# Patient Record
Sex: Female | Born: 2000 | Race: White | Hispanic: No | Marital: Single | Smoking: Never smoker
Health system: Southern US, Community
[De-identification: ages and names within clinical notes are randomized; demographics above are authoritative.]

## PROBLEM LIST (undated history)

## (undated) DIAGNOSIS — Z973 Presence of spectacles and contact lenses: Secondary | ICD-10-CM

## (undated) DIAGNOSIS — Q8740 Marfan's syndrome, unspecified: Secondary | ICD-10-CM

## (undated) DIAGNOSIS — T7840XA Allergy, unspecified, initial encounter: Secondary | ICD-10-CM

## (undated) DIAGNOSIS — M958 Other specified acquired deformities of musculoskeletal system: Secondary | ICD-10-CM

## (undated) DIAGNOSIS — S83241A Other tear of medial meniscus, current injury, right knee, initial encounter: Secondary | ICD-10-CM

## (undated) HISTORY — PX: TYMPANOSTOMY TUBE PLACEMENT: SHX32

## (undated) HISTORY — PX: OTHER SURGICAL HISTORY: SHX169

---
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
## 2019-04-11 ENCOUNTER — Other Ambulatory Visit: Payer: Self-pay | Admitting: Obstetrics & Gynecology

## 2019-04-11 ENCOUNTER — Ambulatory Visit
Admission: RE | Admit: 2019-04-11 | Discharge: 2019-04-11 | Disposition: A | Discharge status: Home / Self Care | Payer: Medicaid Other | Source: Ambulatory Visit | Attending: Obstetrics & Gynecology | Admitting: Obstetrics & Gynecology

## 2019-04-11 ENCOUNTER — Other Ambulatory Visit: Payer: Self-pay

## 2019-04-11 DIAGNOSIS — N6310 Unspecified lump in the right breast, unspecified quadrant: Secondary | ICD-10-CM

## 2019-06-20 ENCOUNTER — Other Ambulatory Visit: Payer: Self-pay | Admitting: Orthopedic Surgery

## 2019-07-16 ENCOUNTER — Other Ambulatory Visit (HOSPITAL_COMMUNITY): Payer: Medicaid Other

## 2019-08-30 ENCOUNTER — Ambulatory Visit (HOSPITAL_BASED_OUTPATIENT_CLINIC_OR_DEPARTMENT_OTHER): Admit: 2019-08-30 | Payer: Medicaid Other | Admitting: Orthopedic Surgery

## 2019-08-30 ENCOUNTER — Encounter (HOSPITAL_BASED_OUTPATIENT_CLINIC_OR_DEPARTMENT_OTHER): Payer: Self-pay

## 2019-08-30 SURGERY — EXCISION, GANGLION CYST, WRIST
Anesthesia: Choice | Site: Wrist | Laterality: Left

## 2019-10-15 ENCOUNTER — Other Ambulatory Visit: Payer: Self-pay

## 2019-10-15 ENCOUNTER — Other Ambulatory Visit: Payer: Self-pay | Admitting: Obstetrics & Gynecology

## 2019-10-15 ENCOUNTER — Ambulatory Visit
Admission: RE | Admit: 2019-10-15 | Discharge: 2019-10-15 | Disposition: A | Discharge status: Home / Self Care | Payer: Medicaid Other | Source: Ambulatory Visit | Attending: Obstetrics & Gynecology | Admitting: Obstetrics & Gynecology

## 2019-10-15 DIAGNOSIS — N6310 Unspecified lump in the right breast, unspecified quadrant: Secondary | ICD-10-CM

## 2019-10-29 ENCOUNTER — Ambulatory Visit (HOSPITAL_COMMUNITY)
Admission: EM | Admit: 2019-10-29 | Discharge: 2019-10-29 | Disposition: A | Discharge status: Home / Self Care | Payer: Medicaid Other | Attending: Emergency Medicine | Admitting: Emergency Medicine

## 2019-10-29 ENCOUNTER — Other Ambulatory Visit: Payer: Self-pay

## 2019-10-29 ENCOUNTER — Encounter (HOSPITAL_COMMUNITY): Payer: Self-pay

## 2019-10-29 DIAGNOSIS — N390 Urinary tract infection, site not specified: Secondary | ICD-10-CM | POA: Insufficient documentation

## 2019-10-29 DIAGNOSIS — Z3202 Encounter for pregnancy test, result negative: Secondary | ICD-10-CM

## 2019-10-29 LAB — POCT URINALYSIS DIPSTICK, ED / UC
Bilirubin Urine: NEGATIVE
Glucose, UA: NEGATIVE mg/dL
Ketones, ur: NEGATIVE mg/dL
Nitrite: NEGATIVE
Protein, ur: 30 mg/dL — AB
Specific Gravity, Urine: 1.015 (ref 1.005–1.030)
Urobilinogen, UA: 0.2 mg/dL (ref 0.0–1.0)
pH: 7 (ref 5.0–8.0)

## 2019-10-29 LAB — POC URINE PREG, ED: Preg Test, Ur: NEGATIVE

## 2019-10-29 MED ORDER — NITROFURANTOIN MONOHYD MACRO 100 MG PO CAPS: 100.0000 mg | ORAL_CAPSULE | Freq: Two times a day (BID) | ORAL | 0 refills | Status: AC

## 2019-10-29 NOTE — ED Triage Notes (Signed)
Pt presents with pressure in urinary tract and abdominal pain x 2 days.

## 2019-10-29 NOTE — Discharge Instructions (Signed)
Urine showed evidence of infection. We are treating you with macrobid- twice daily for 5 days. Be sure to take full course. Stay hydrated- urine should be pale yellow to clear.  °Please return or follow up with your primary provider if symptoms not improving with treatment. Please return sooner if you have worsening of symptoms or develop fever, nausea, vomiting, abdominal pain, back pain, lightheadedness, dizziness. °

## 2019-10-30 NOTE — ED Provider Notes (Signed)
[facility]    CSN: [id number] Arrival date & time: 10/29/19  1903      History   Chief Complaint Chief Complaint  Patient presents with   Urinary Tract Infection    HPI Elizabeth Lucero is a 19 y.o. female presenting today for evaluation of possible UTI.  Patient reports that over the past 2 days here she has had urinary frequency, urgency, incomplete voiding as well as continued pressure in her lower abdomen.  Denies dysuria.  Denies hematuria.  Denies vaginal symptoms.  Is on oral contraceptives.  Denies significant history of UTIs.  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 09/24/2015   Marfan syndrome 06/23/2011    Past Surgical History:  Procedure Laterality Date   ADENOIDECTOMY  08/01/2003   CLOSED REDUCTION NASAL FRACTURE  02/08/2010   FOOT FOREIGN BODY REMOVAL Left 10/27/2005   KNEE ARTHROSCOPY WITH DRILLING/MICROFRACTURE Right 09/29/2015   Procedure: RIGHT KNEE ARTHROSCOPY WITH MEDIAL MENISCECTOMY , MICROFRACTURE DRILLING;  Surgeon: Salvatore Marvel, MD;  Location: [facility];  Service: Orthopedics;  Laterality: Right;   PERCUTANEOUS PINNING ELBOW FRACTURE Left 12/09/2008   medial epicondyle   Surgery on right eye orbital floor     Cheerleading accident   TYMPANOSTOMY TUBE PLACEMENT Bilateral 12/07/2001; 08/01/2003    OB History   No obstetric history on file.      Home Medications    Prior to Admission medications   Medication Sig Start Date End Date Taking? Authorizing Provider  acetaminophen (TYLENOL) 500 MG tablet Take 1,000 mg  by mouth every 6 (six) hours as needed.    [provider]  atenolol (TENORMIN) 25 MG tablet Take 25 mg by mouth. 01/21/15   [provider]  butalbital-acetaminophen-caffeine (FIORICET, ESGIC) 50-325-40 MG tablet Take 1 tablet by mouth every 6 (six) hours as needed for headache or migraine. 04/30/16   Trixie Dredge, PA-C  cetirizine (ZYRTEC) 10 MG tablet Take 10 mg by mouth. 06/23/11   [provider]  ibuprofen (ADVIL,MOTRIN) 200 MG tablet Take 400-800 mg by mouth every 6 (six) hours as needed (migraine).    [provider]  nitrofurantoin, macrocrystal-monohydrate, (MACROBID) 100 MG capsule Take 1 capsule (100 mg total) by mouth 2 (two) times daily for 5 days. 10/29/19 11/03/19  Juaquina Machnik C, PA-C  ondansetron (ZOFRAN) 4 MG tablet Take 1 tablet (4 mg total) by mouth every 8 (eight) hours as needed for nausea or vomiting. 04/30/16   Trixie Dredge, PA-C  SPRINTEC 28 0.25-35 MG-MCG tablet TAKE 1 TABLET BY MOUTH EVERY DAY (START ON THE SUNDAY AFTER NEXT CYCLE) 07/09/15   [provider]    Family History Family History  Problem Relation Age of Onset   Marfan syndrome Mother    Diabetes Other    Cancer - Colon Other     Social History Social History   Tobacco Use   Smoking status: Never Smoker   Smokeless tobacco: Never Used  Substance Use Topics   Alcohol use: No    Alcohol/week:  0.0 standard drinks   Drug use: No     Allergies   Codeine and Milk-related compounds   Review of Systems Review of Systems  Constitutional: Negative for fever.  Respiratory: Negative for shortness of breath.   Cardiovascular: Negative for chest pain.  Gastrointestinal: Positive for abdominal pain. Negative for diarrhea, nausea and vomiting.  Genitourinary: Positive for frequency. Negative for dysuria, flank pain, genital sores, hematuria, menstrual problem, vaginal bleeding, vaginal discharge and vaginal pain.  Musculoskeletal: Negative for back pain.    Skin: Negative for rash.  Neurological: Negative for dizziness, light-headedness and headaches.     Physical Exam Triage Vital Signs ED Triage Vitals  Enc Vitals Group     BP 10/29/19 2025 (!) 109/97     Pulse Rate 10/29/19 2025 100     Resp 10/29/19 2025 18     Temp --      Temp Source 10/29/19 2025 Oral     SpO2 10/29/19 2025 100 %     Weight --      Height --      Head Circumference --      Peak Flow --      Pain Score 10/29/19 2027 6     Pain Loc --      Pain Edu? --      Excl. in GC? --    No data found.  Updated Vital Signs BP (!) 109/97 (BP Location: Right Arm)    Pulse 100    Resp 18    SpO2 100%   Visual Acuity Right Eye Distance:   Left Eye Distance:   Bilateral Distance:    Right Eye Near:   Left Eye Near:    Bilateral Near:     Physical Exam Vitals and nursing note reviewed.  Constitutional:      Appearance: She is well-developed.     Comments: No acute distress  HENT:     Head: Normocephalic and atraumatic.     Nose: Nose normal.  Eyes:     Conjunctiva/sclera: Conjunctivae normal.  Cardiovascular:     Rate and Rhythm: Normal rate.  Pulmonary:     Effort: Pulmonary effort is normal. No respiratory distress.  Abdominal:     General: There is no distension.  Musculoskeletal:        General: Normal range of motion.     Cervical back: Neck supple.  Skin:    General: Skin is warm and dry.  Neurological:     Mental Status: She is alert and oriented to person, place, and time.      UC Treatments / Results  Labs (all labs ordered are listed, but only abnormal results are displayed) Labs Reviewed  POCT URINALYSIS DIPSTICK, ED / UC - Abnormal; Notable for the following components:      Result Value   Hgb urine dipstick MODERATE (*)    Protein, ur 30 (*)    Leukocytes,Ua LARGE (*)    All other components within normal limits  URINE CULTURE  POC URINE PREG, ED    EKG   Radiology No results found.  Procedures Procedures (including  critical care time)  Medications Ordered in UC Medications - No data to display  Initial Impression / Assessment and Plan / UC Course  I have reviewed the triage vital signs and the nursing notes.  Pertinent labs & imaging results that were available during my care of the patient were reviewed by me and considered in my medical decision making (see chart for  details).     Large leuks on UA, will send for culture urine culture.  Treating for UTI empirically with Macrobid twice daily x5 days.  Will alter results based off culture if needed.  Push fluids.  Vital signs stable.  Discussed strict return precautions. Patient verbalized understanding and is agreeable with plan.  Final Clinical Impressions(s) / UC Diagnoses   Final diagnoses:  Lower urinary tract infectious disease     Discharge Instructions     Urine showed evidence of infection. We are treating you with macrobid- twice daily for 5 days. Be sure to take full course. Stay hydrated- urine should be pale yellow to clear.   Please return or follow up with your primary provider if symptoms not improving with treatment. Please return sooner if you have worsening of symptoms or develop fever, nausea, vomiting, abdominal pain, back pain, lightheadedness, dizziness.   ED Prescriptions    Medication Sig Dispense Auth. Provider   nitrofurantoin, macrocrystal-monohydrate, (MACROBID) 100 MG capsule Take 1 capsule (100 mg total) by mouth 2 (two) times daily for 5 days. 10 capsule Jalon Blackwelder, [place] C, PA-C     PDMP not reviewed this encounter.   Lew Dawes, PA-C 10/30/19 1149

## 2019-11-01 LAB — URINE CULTURE: Culture: >=100000 — AB

## 2019-12-10 ENCOUNTER — Encounter: Payer: Self-pay | Admitting: Neurology

## 2020-02-24 NOTE — Progress Notes (Deleted)
NEUROLOGY CONSULTATION NOTE  [name] MRN: [id number] DOB: Aug 03, 2000  Referring provider: Lynelle Smoke, MD Primary care provider: Velvet Bathe, MD  Reason for consult:  migraine   Subjective:  Elizabeth Lucero is a 19 year old ***-handed female with Marfan syndrome, asthma and anxiety who presents for migraines.  History supplemented by referring provider's note.  Onset:  *** Location:  *** Quality:  *** Intensity:  ***.  *** denies new headache, thunderclap headache or severe headache that wakes her from sleep. Aura:  *** Prodrome:  *** Postdrome:  *** Associated symptoms:  ***.  *** denies associated unilateral numbness or weakness. Duration:  *** Frequency:  *** Frequency of abortive medication: *** Triggers:  *** Relieving factors:  *** Activity:  ***  Prior brain imaging: CT head without contrast on 04/10/2016 personally reviewed and was unremarkable.  MRI of brain with and without contrast from 04/27/2005 personally reviewed showed nonspecific patchy bilateral frontal hyperintense T2/FLAIR foci without enhancement.  Repeat MRI of brain without contrast on 05/06/2010 personally reviewed and findings appeared stable.    Current NSAIDS/analgesics:  *** Current triptans:  *** Current ergotamine:  *** Current anti-emetic:  *** Current muscle relaxants:  *** Current Antihypertensive medications:  Atenolol 25mg  BID Current Antidepressant medications:  Sertraline 50mg  Current Anticonvulsant medications:  *** Current anti-CGRP:  *** Current Vitamins/Herbal/Supplements:  Magnesium 500mg , D  Current Antihistamines/Decongestants:  Zyrtec Other therapy:  *** Hormone/birth control:  *** Other medications:  ***  Past NSAIDS/analgesics:  *** Past abortive triptans:  *** Past abortive ergotamine:  *** Past muscle relaxants:  *** Past anti-emetic:  *** Past antihypertensive medications:  *** Past antidepressant medications:  *** Past anticonvulsant medications:   *** Past anti-CGRP:  *** Past vitamins/Herbal/Supplements:  *** Past antihistamines/decongestants:  *** Other past therapies:  ***  Caffeine:  *** Alcohol:  *** Smoker:  *** Diet:  *** Exercise:  *** Depression:  ***; Anxiety:  *** Other pain:  *** Sleep hygiene:  *** Family history of headache:  ***      PAST MEDICAL HISTORY: Past Medical History:  Diagnosis Date  . Acute medial meniscus tear of right knee 09/24/2015  . Allergy    Seasonal   . Marfan syndrome    treated by [facility] ped and [facility] card Dr 09/26/2015  . 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/24/2015  . Wears glasses     PAST SURGICAL HISTORY: Past Surgical History:  Procedure Laterality Date  . ADENOIDECTOMY  08/01/2003  . CLOSED REDUCTION NASAL FRACTURE  02/08/2010  . FOOT FOREIGN BODY REMOVAL Left 10/27/2005  . KNEE ARTHROSCOPY WITH DRILLING/MICROFRACTURE Right 09/29/2015   Procedure: RIGHT KNEE ARTHROSCOPY WITH MEDIAL MENISCECTOMY , MICROFRACTURE DRILLING;  Surgeon: 14/07/2009, MD;  Location: [facility];  Service: Orthopedics;  Laterality: Right;  . PERCUTANEOUS PINNING ELBOW FRACTURE Left 12/09/2008   medial epicondyle  . Surgery on right eye orbital floor     Cheerleading accident  . TYMPANOSTOMY TUBE PLACEMENT Bilateral 12/07/2001; 08/01/2003    MEDICATIONS: Current Outpatient Medications on File Prior to Visit  Medication Sig Dispense Refill  . acetaminophen (TYLENOL) 500 MG tablet Take 1,000 mg by mouth every 6 (six) hours as needed.    02/08/2009 atenolol (TENORMIN) 25 MG tablet Take 25 mg by mouth.    . butalbital-acetaminophen-caffeine (FIORICET, ESGIC) 50-325-40 MG tablet Take 1 tablet by mouth every 6 (six) hours as needed for headache or migraine. 10 tablet 0  . cetirizine (ZYRTEC) 10 MG tablet Take 10  mg by mouth.    Marland Kitchen ibuprofen (ADVIL,MOTRIN) 200 MG tablet Take 400-800 mg by mouth every 6 (six) hours as needed (migraine).    . ondansetron (ZOFRAN) 4 MG tablet Take 1 tablet (4 mg  total) by mouth every 8 (eight) hours as needed for nausea or vomiting. 15 tablet 0  . SPRINTEC 28 0.25-35 MG-MCG tablet TAKE 1 TABLET BY MOUTH EVERY DAY (START ON THE SUNDAY AFTER NEXT CYCLE)  5   Current Facility-Administered Medications on File Prior to Visit  Medication Dose Route Frequency Provider Last Rate Last Admin  . traMADol (ULTRAM) tablet 50-100 mg  50-100 mg Oral Q6H PRN Shepperson, Kirstin, PA-C        ALLERGIES: Allergies  Allergen Reactions  . Codeine Itching  . Milk-Related Compounds Nausea And Vomiting    Stomach upset with milk    FAMILY HISTORY: Family History  Problem Relation Age of Onset  . Marfan syndrome Mother   . Diabetes Other   . Cancer - Colon Other    ***.  SOCIAL HISTORY: Social History   Socioeconomic History  . Marital status: Single    Spouse name: Not on file  . Number of children: Not on file  . Years of education: Not on file  . Highest education level: Not on file  Occupational History  . Not on file  Tobacco Use  . Smoking status: Never Smoker  . Smokeless tobacco: Never Used  Substance and Sexual Activity  . Alcohol use: No    Alcohol/week: 0.0 standard drinks  . Drug use: No  . Sexual activity: Yes    Birth control/protection: Condom, Pill  Other Topics Concern  . Not on file  Social History Narrative  . Not on file   Social Determinants of Health   Financial Resource Strain: Not on file  Food Insecurity: Not on file  Transportation Needs: Not on file  Physical Activity: Not on file  Stress: Not on file  Social Connections: Not on file  Intimate Partner Violence: Not on file    Objective:  *** General: No acute distress.  Patient appears well-groomed.   Head:  Normocephalic/atraumatic Eyes:  fundi examined but not visualized Neck: supple, no paraspinal tenderness, full range of motion Back: No paraspinal tenderness Heart: regular rate and rhythm Lungs: Clear to auscultation bilaterally. Vascular: No  carotid bruits. Neurological Exam: Mental status: alert and oriented to person, place, and time, recent and remote memory intact, fund of knowledge intact, attention and concentration intact, speech fluent and not dysarthric, language intact. Cranial nerves: CN I: not tested CN II: pupils equal, round and reactive to light, visual fields intact CN III, IV, VI:  full range of motion, no nystagmus, no ptosis CN V: facial sensation intact. CN VII: upper and lower face symmetric CN VIII: hearing intact CN IX, X: gag intact, uvula midline CN XI: sternocleidomastoid and trapezius muscles intact CN XII: tongue midline Bulk & Tone: normal, no fasciculations. Motor:  muscle strength 5/5 throughout Sensation:  Pinprick, temperature and vibratory sensation intact. Deep Tendon Reflexes:  2+ throughout,  toes downgoing.   Finger to nose testing:  Without dysmetria.   Heel to shin:  Without dysmetria.   Gait:  Normal station and stride.  Romberg negative.  Assessment/Plan:   ***    Thank you for allowing me to take part in the care of this patient.  Shon Millet, DO  CC:  Elizabeth Bathe, MD  Elizabeth Smoke, MD

## 2020-02-25 ENCOUNTER — Ambulatory Visit: Payer: Medicaid Other | Admitting: Neurology

## 2020-03-10 IMAGING — US ULTRASOUND RIGHT BREAST LIMITED
1 series · 6 of 6 positions shown · non-contrast
Comparison: Previous exam(s).

CLINICAL DATA: Palpable abnormality in the RIGHT breast.

EXAM:
ULTRASOUND OF THE RIGHT BREAST

[Series 1: ultrasound right breast limited · 0.07mm/px · 6 of 6 slices shown]
[im 1/6]
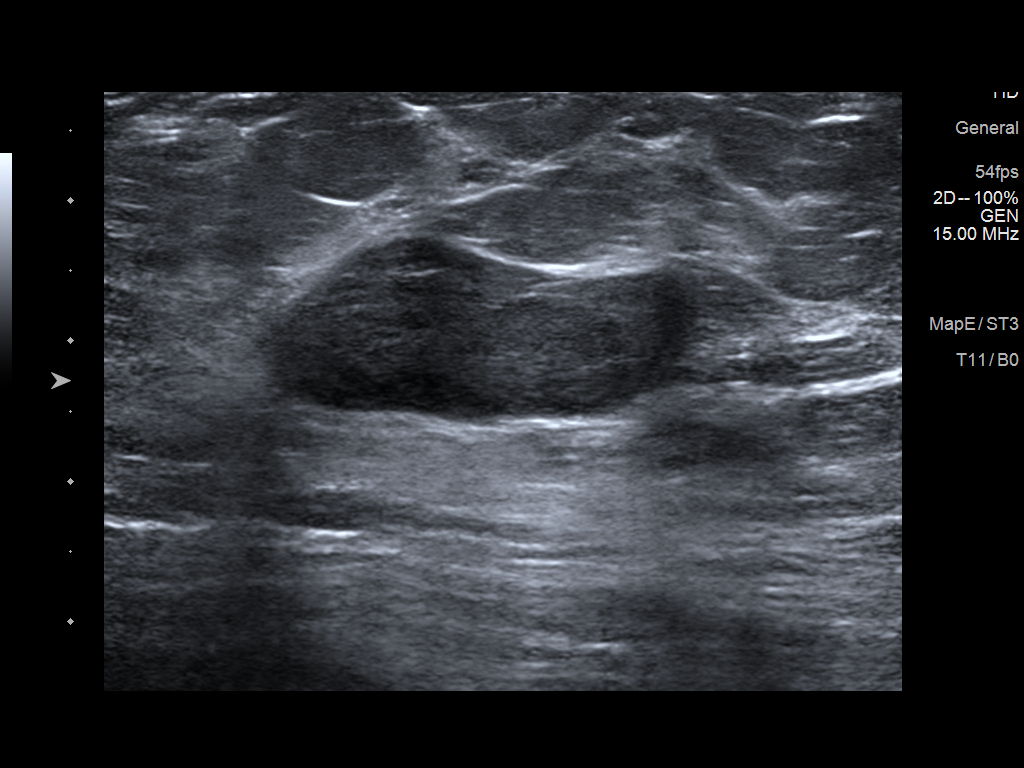
[im 2/6]
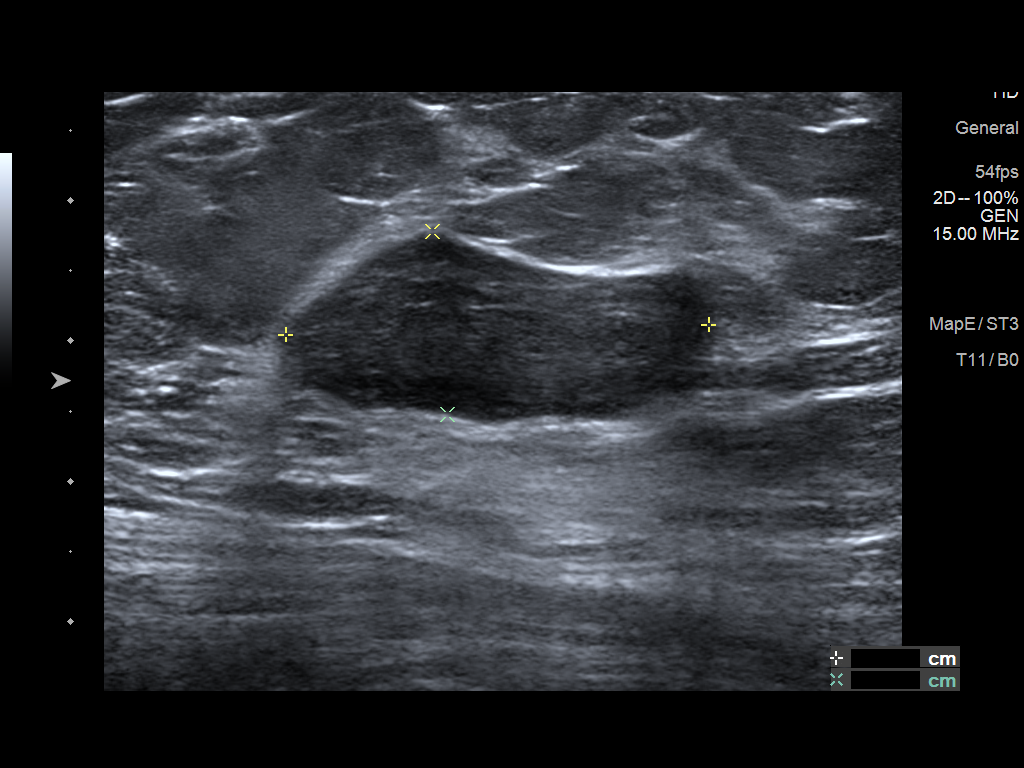
[im 3/6]
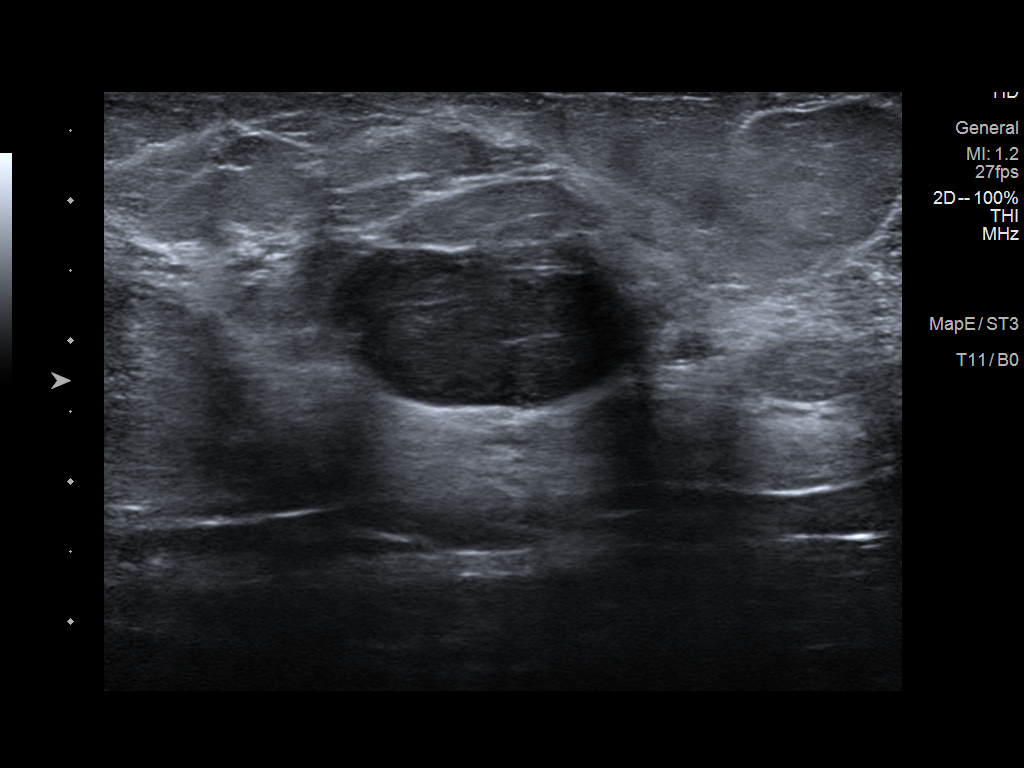
[im 4/6]
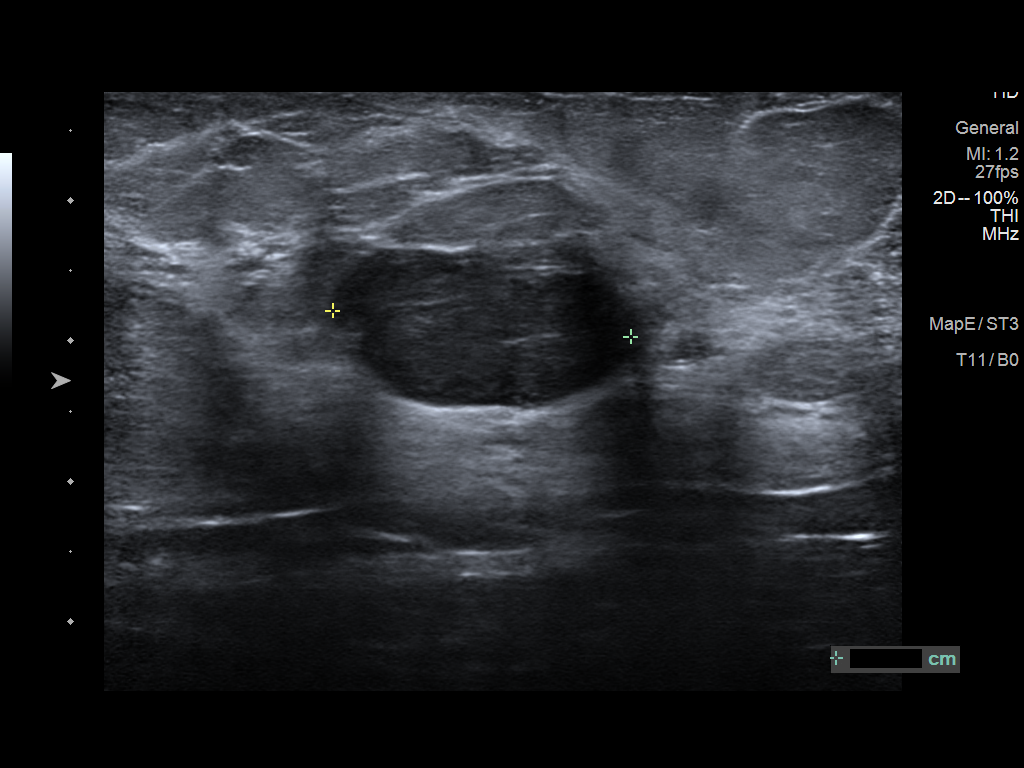
[im 5/6]
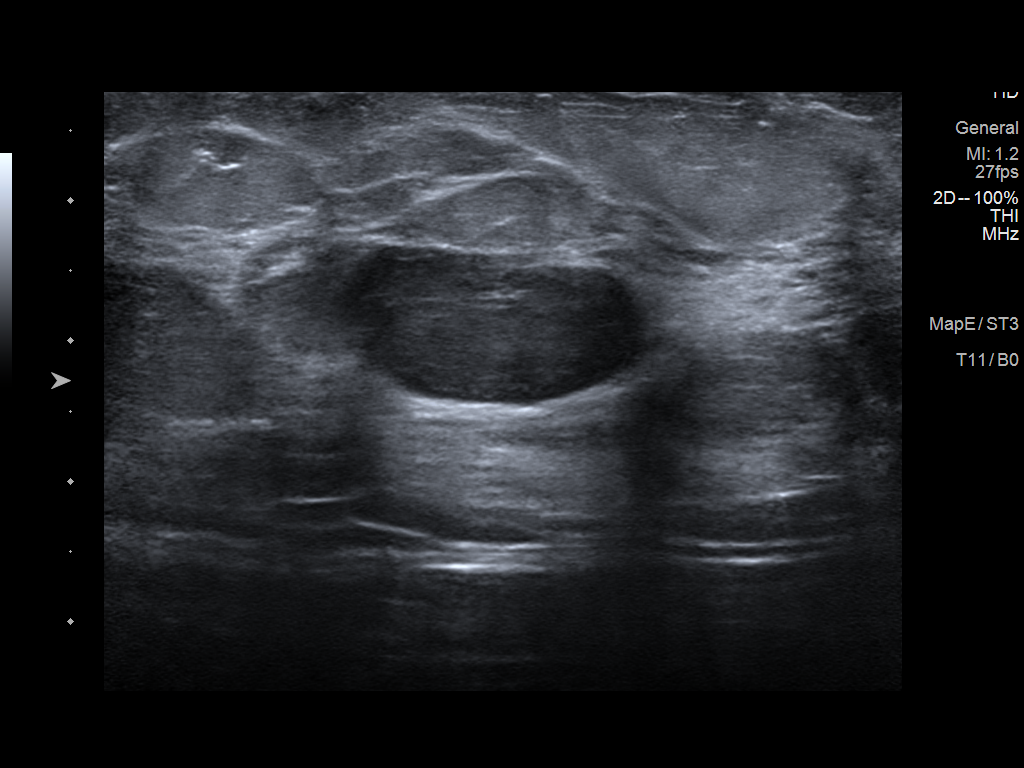
[im 6/6]
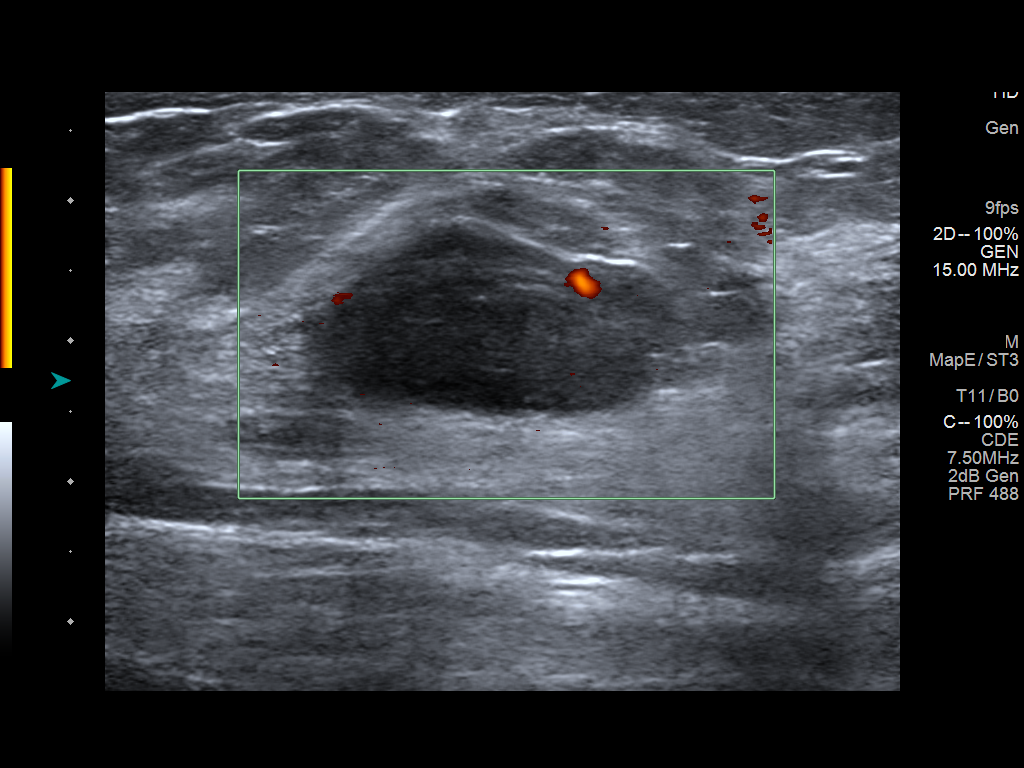

[6 of 6 positions shown; findings below may reference images not displayed]

FINDINGS: On physical exam, there is a rounded mobile mass in the 10 o'clock
location of the RIGHT breast.

Targeted ultrasound is performed, showing an oval hypoechoic
parallel mass with posterior acoustic enhancement in the 10 o'clock
location of the RIGHT breast 4 centimeters from the nipple which
measures 3.0 x 1.3 x 2.1 centimeters. There is minimal associated
internal vascularity.
IMPRESSION: Probable fibroadenoma in the 10 o'clock location of the RIGHT
breast. We discussed management options including excision, biopsy,
and close follow-up. Imaging followup is recommended at 6, 12, and
24 months to assess stability. The patient concurs with this plan.

RECOMMENDATION:
RIGHT breast ultrasound recommended in 6 months.

I have discussed the findings and recommendations with the patient.
Results were also provided in writing at the conclusion of the
visit. If applicable, a reminder letter will be sent to the patient
regarding the next appointment.

BI-RADS CATEGORY  3: Probably benign.

## 2020-04-20 ENCOUNTER — Other Ambulatory Visit: Payer: Self-pay

## 2020-04-20 ENCOUNTER — Other Ambulatory Visit: Payer: Self-pay | Admitting: Obstetrics & Gynecology

## 2020-04-20 ENCOUNTER — Ambulatory Visit
Admission: RE | Admit: 2020-04-20 | Discharge: 2020-04-20 | Disposition: A | Discharge status: Home / Self Care | Payer: Medicaid Other | Source: Ambulatory Visit | Attending: Obstetrics & Gynecology | Admitting: Obstetrics & Gynecology

## 2020-04-20 DIAGNOSIS — N6310 Unspecified lump in the right breast, unspecified quadrant: Secondary | ICD-10-CM

## 2020-04-22 ENCOUNTER — Other Ambulatory Visit: Payer: Self-pay

## 2020-04-22 ENCOUNTER — Other Ambulatory Visit: Payer: Self-pay | Admitting: Obstetrics & Gynecology

## 2020-04-22 ENCOUNTER — Other Ambulatory Visit: Payer: Self-pay | Admitting: General Practice

## 2020-04-22 ENCOUNTER — Ambulatory Visit
Admission: RE | Admit: 2020-04-22 | Discharge: 2020-04-22 | Disposition: A | Discharge status: Home / Self Care | Payer: Medicaid Other | Source: Ambulatory Visit | Attending: Obstetrics & Gynecology | Admitting: Obstetrics & Gynecology

## 2020-04-22 DIAGNOSIS — N6310 Unspecified lump in the right breast, unspecified quadrant: Secondary | ICD-10-CM

## 2020-04-24 ENCOUNTER — Other Ambulatory Visit: Payer: Medicaid Other

## 2020-10-27 ENCOUNTER — Ambulatory Visit: Payer: Medicaid Other | Admitting: Neurology

## 2021-01-04 ENCOUNTER — Encounter: Payer: Self-pay | Admitting: Neurology

## 2021-01-04 ENCOUNTER — Ambulatory Visit (INDEPENDENT_AMBULATORY_CARE_PROVIDER_SITE_OTHER): Payer: Medicaid Other | Admitting: Neurology

## 2021-01-04 ENCOUNTER — Other Ambulatory Visit: Payer: Self-pay

## 2021-01-04 VITALS — BP 115/77 | HR 51 | Ht 67.0 in | Wt 215.8 lb

## 2021-01-04 DIAGNOSIS — G43109 Migraine with aura, not intractable, without status migrainosus: Secondary | ICD-10-CM

## 2021-01-04 MED ORDER — ONDANSETRON 4 MG PO TBDP: 4.0000 mg | ORAL_TABLET | Freq: Three times a day (TID) | ORAL | 3 refills | Status: AC | PRN

## 2021-01-04 MED ORDER — RIZATRIPTAN BENZOATE 10 MG PO TBDP: 10.0000 mg | ORAL_TABLET | ORAL | 11 refills | Status: AC | PRN

## 2021-01-04 NOTE — Progress Notes (Signed)
[facility]    Provider:  Dr Lucia Gaskins Requesting Provider: Velvet Bathe, MD Primary Care Provider:  Velvet Bathe, MD  CC:  Migraine with aura  HPI:  Elizabeth Lucero is a 20 y.o. female here as requested by Velvet Bathe, MD for frequent headaches. PMHx Marfan Syndrome. Surgery of right orbital floor,nasal fracture. It is in both eyes, different flashes of color, same thing over and over, followed by head pain which is holocephalic, eyes hurt, pounding/pulsating/throbbing, photo/phonophobia, nausea, vomiting, moving makes it hurt worse, a dark room helps, No known triggers, weather doesn't trigger, caffeine withdrawal may be a trigger, not hormonal around periods, occur during the day, sleeping helps, not positional or exertional, no changes in quality, she always has migraines never has regular headaches. They can last up to 24 hours at least 4 days a month and some months can have a headache for 7 days straight, no other headaches. They can be severe 10/10. No other focal neurologic deficits, associated symptoms, inciting events or modifiable factors.She states her mother had migraine with aura/ocular migraines. She has had these since 5th great. She has an alteration in vision.   Reviewed notes, labs and imaging from outside physicians, which showed:  CT Head 04/30/2016:   Ct showed No acute intracranial abnormalities including mass lesion or mass effect, hydrocephalus, extra-axial fluid collection, midline shift, hemorrhage, or acute infarction, large ischemic events (personally reviewed images)   MRI brain: 05/06/2010: FINDINGS: Brain: Ventricles, cisterns and other CSF spaces are within normal. There is no mass, mass effect, shift of midline structures or acute hemorrhage. No evidence of acute infarction.  Vascular: Within normal.  Skull: Within normal.  Sinuses/Orbits: Within normal.  Other: None.  IMPRESSION: No acute intracranial findings. (By my review  also some non-specific chronic frontal white matter changes likely microvascular ischemia or sequelae of migraines)  From a thorough review of records, medications tried that can be used in headache and migraine management include: Atenolol, Fioricet, Benadryl, ibuprofen, ketorolac injections, Decadron injections, Zofran tablet and injection, Compazine injection, scopolamine patch, tramadol, ubrelvy(helped) samples, sertraline, amitriptyline/nortrip contraindicated due to being on another SSRI sertraline. Never tried Topiramate.   Review of Systems: Patient complains of symptoms per HPI as well as the following symptoms fatigue. Pertinent negatives and positives per HPI. All others negative.   Social History   Socioeconomic History   Marital status: Single    Spouse name: Not on file   Number of children: Not on file   Years of education: Not on file   Highest education level: Not on file  Occupational History   Not on file  Tobacco Use   Smoking status: Never   Smokeless tobacco: Never  Vaping Use   Vaping Use: Former   Devices: quit 2 yrs ago  Substance and Sexual Activity   Alcohol use: No    Alcohol/week: 0.0 standard drinks   Drug use: No   Sexual activity: Yes    Birth control/protection: Condom, Pill  Other Topics Concern   Not on file  Social History Narrative   Caffeine cup daily.  Education: nursing school,   Working: Production assistant, radio.     Social Determinants of Health   Financial Resource Strain: Not on file  Food Insecurity: Not on file  Transportation Needs: Not on file  Physical Activity: Not on file  Stress: Not on file  Social Connections: Not on file  Intimate Partner Violence: Not on file    Family History  Problem Relation Age of Onset  Marfan syndrome Mother    Sjogren's syndrome Maternal Grandmother    Arthritis/Rheumatoid Maternal Grandmother    Diabetes type II Paternal Grandmother    Diabetes Other    Cancer - Colon Other     Past Medical  History:  Diagnosis Date   Acute medial meniscus tear of right knee 09/24/2015   Allergy    Seasonal    Marfan syndrome    treated by [facility] ped Leona Singleton and [facility] card Dr Elizebeth Brooking   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/24/2015   Wears glasses     Patient Active Problem List   Diagnosis Date Noted   Migraine with aura and without status migrainosus, not intractable 01/04/2021   Acute medial meniscus tear of right knee 09/24/2015   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 09/24/2015   Marfan syndrome 06/23/2011    Past Surgical History:  Procedure Laterality Date   ADENOIDECTOMY  08/01/2003   CLOSED REDUCTION NASAL FRACTURE  02/08/2010   FOOT FOREIGN BODY REMOVAL Left 10/27/2005   KNEE ARTHROSCOPY WITH DRILLING/MICROFRACTURE Right 09/29/2015   Procedure: RIGHT KNEE ARTHROSCOPY WITH MEDIAL MENISCECTOMY , MICROFRACTURE DRILLING;  Surgeon: Salvatore Marvel, MD;  Location: [facility];  Service: Orthopedics;  Laterality: Right;   PERCUTANEOUS PINNING ELBOW FRACTURE Left 12/09/2008   medial epicondyle   Surgery on right eye orbital floor     Cheerleading accident   TYMPANOSTOMY TUBE PLACEMENT Bilateral 12/07/2001; 08/01/2003    Current Outpatient Medications  Medication Sig Dispense Refill   acetaminophen (TYLENOL) 500 MG tablet Take 1,000 mg by mouth every 6 (six) hours as needed.     atenolol (TENORMIN) 25 MG tablet Take 25 mg by mouth.     cetirizine (ZYRTEC) 10 MG tablet Take 10 mg by mouth.     Cholecalciferol 125 MCG/ML LIQD daily.     ibuprofen (ADVIL,MOTRIN) 200 MG tablet Take 400-800 mg by mouth every 6 (six) hours as needed (migraine).     magnesium gluconate (MAGONATE) 500 MG tablet daily.     Multiple Vitamin (MULTI-VITAMINS) TABS Take 1 tablet by mouth daily.     norgestimate-ethinyl estradiol (ORTHO-CYCLEN) 0.25-35 MG-MCG tablet Take 1 tablet by mouth daily.     ondansetron (ZOFRAN-ODT) 4 MG disintegrating tablet Take 1-2 tablets (4-8 mg total)  by mouth every 8 (eight) hours as needed. 30 tablet 3   rizatriptan (MAXALT-MLT) 10 MG disintegrating tablet Take 1 tablet (10 mg total) by mouth as needed for migraine. May repeat in 2 hours if needed 9 tablet 11   sertraline (ZOLOFT) 50 MG tablet 50 mg nightly.     Current Facility-Administered Medications  Medication Dose Route Frequency Provider Last Rate Last Admin   traMADol (ULTRAM) tablet 50-100 mg  50-100 mg Oral Q6H PRN Shepperson, Kirstin, PA-C        Allergies as of 01/04/2021 - Review Complete 01/04/2021  Allergen Reaction Noted   Codeine Itching 09/24/2015   Milk-related compounds Nausea And Vomiting 09/24/2015    Vitals: BP 115/77   Pulse (!) 51   Ht 5\' 7"  (1.702 m)   Wt 215 lb 12.8 oz (97.9 kg)   BMI 33.80 kg/m  Last Weight:  Wt Readings from Last 1 Encounters:  01/04/21 215 lb 12.8 oz (97.9 kg)   Last Height:   Ht Readings from Last 1 Encounters:  01/04/21 5\' 7"  (1.702 m)     Physical exam: Exam: Gen: NAD, conversant, well nourised, obese, well groomed  CV: RRR, no MRG. No Carotid Bruits. No peripheral edema, warm, nontender Eyes: Conjunctivae clear without exudates or hemorrhage  Neuro: Detailed Neurologic Exam  Speech:    Speech is normal; fluent and spontaneous with normal comprehension.  Cognition:    The patient is oriented to person, place, and time;     recent and remote memory intact;     language fluent;     normal attention, concentration,     fund of knowledge Cranial Nerves:    The pupils are equal, round, and reactive to light. The fundi are flat. Visual fields are full to finger confrontation. Extraocular movements are intact. Trigeminal sensation is intact and the muscles of mastication are normal. The face is symmetric. The palate elevates in the midline. Hearing intact. Voice is normal. Shoulder shrug is normal. The tongue has normal motion without fasciculations.   Coordination:    Normal  Gait:   normal.    Motor Observation:    No asymmetry, no atrophy, and no involuntary movements noted. Tone:    Normal muscle tone.    Posture:    Posture is normal. normal erect    Strength:    Strength is V/V in the upper and lower limbs.      Sensation: intact to LT     Reflex Exam:  DTR's:    Deep tendon reflexes in the upper and lower extremities are normal bilaterally.   Toes:    The toes are downgoing bilaterally.   Clonus:    Clonus is absent.    Assessment/Plan:  Patient with episodic migraine with aura.   Acute/emergent: Rizatriptan: Please take one tablet at the onset of your headache. If it does not improve the symptoms please take one additional tablet. Do not take more then 2 tablets in 24hrs. Do not take use more then 2 to 3 times in a week. Take with Ondansetron(zofran). Ondansetron: alone for nausea/motion sickness.  Discussed increased risk of stoke and estrogen in migraine with aura Discussed: To prevent or relieve headaches, try the following: Cool Compress. Lie down and place a cool compress on your head.  Avoid headache triggers. If certain foods or odors seem to have triggered your migraines in the past, avoid them. A headache diary might help you identify triggers.  Include physical activity in your daily routine. Try a daily walk or other moderate aerobic exercise.  Manage stress. Find healthy ways to cope with the stressors, such as delegating tasks on your to-do list.  Practice relaxation techniques. Try deep breathing, yoga, massage and visualization.  Eat regularly. Eating regularly scheduled meals and maintaining a healthy diet might help prevent headaches. Also, drink plenty of fluids.  Follow a regular sleep schedule. Sleep deprivation might contribute to headaches Consider biofeedback. With this mind-body technique, you learn to control certain bodily functions -- such as muscle tension, heart rate and blood pressure -- to prevent headaches or reduce headache  pain.    Proceed to emergency room if you experience new or worsening symptoms or symptoms do not resolve, if you have new neurologic symptoms or if headache is severe, or for any concerning symptom.   Provided education and documentation from American headache Society toolbox including articles on: chronic migraine medication overuse headache, chronic migraines, prevention of migraines, behavioral and other nonpharmacologic treatments for headache.     Orders Placed This Encounter  Procedures   CBC with Differential/Platelets   Comprehensive metabolic panel   TSH    Meds ordered this encounter  Medications   rizatriptan (MAXALT-MLT) 10 MG disintegrating tablet    Sig: Take 1 tablet (10 mg total) by mouth as needed for migraine. May repeat in 2 hours if needed    Dispense:  9 tablet    Refill:  11   ondansetron (ZOFRAN-ODT) 4 MG disintegrating tablet    Sig: Take 1-2 tablets (4-8 mg total) by mouth every 8 (eight) hours as needed.    Dispense:  30 tablet    Refill:  3     Cc: Velvet Bathe, MD,  Velvet Bathe, MD  Naomie Dean, MD  [facility] [address] [place], [place] [address]  Phone [phone] Fax [phone]

## 2021-01-04 NOTE — Patient Instructions (Addendum)
Acute/emergent: Rizatriptan: Please take one tablet at the onset of your headache. If it does not improve the symptoms please take one additional tablet. Do not take more then 2 tablets in 24hrs. Do not take use more then 2 to 3 times in a week. Take with Ondansetron(zofran). Ondansetron: alone for nausea/motion sickness.   There is increased risk for stroke in women with migraine with aura and a contraindication for the combined contraceptive pill for use by women who have migraine with aura. The risk for women with migraine without aura is lower. However other risk factors like smoking are far more likely to increase stroke risk than migraine. There is a recommendation for no smoking and for the use of OCPs without estrogen such as progestogen only pills particularly for women with migraine with aura.Marland Kitchen People who have migraine headaches with auras may be 3 times more likely to have a stroke caused by a blood clot, compared to migraine patients who don't see auras. Women who take hormone-replacement therapy may be 30 percent more likely to suffer a clot-based stroke than women not taking medication containing estrogen. Other risk factors like smoking and high blood pressure may be  much more important.  Migraine Headache A migraine headache is an intense, throbbing pain on one side or both sides of the head. Migraine headaches may also cause other symptoms, such as nausea, vomiting, and sensitivity to light and noise. A migraine headache can last from 4 hours to 3 days. Talk with your doctor about what things may bring on (trigger) your migraine headaches. What are the causes? The exact cause of this condition is not known. However, a migraine may be caused when nerves in the brain become irritated and release chemicals that cause inflammation of blood vessels. This inflammation causes pain. This condition may be triggered or caused by: Drinking alcohol. Smoking. Taking medicines, such as: Medicine  used to treat chest pain (nitroglycerin). Birth control pills. Estrogen. Certain blood pressure medicines. Eating or drinking products that contain nitrates, glutamate, aspartame, or tyramine. Aged cheeses, chocolate, or caffeine may also be triggers. Doing physical activity. Other things that may trigger a migraine headache include: Menstruation. Pregnancy. Hunger. Stress. Lack of sleep or too much sleep. Weather changes. Fatigue. What increases the risk? The following factors may make you more likely to experience migraine headaches: Being a certain age. This condition is more common in people who are 17-58 years old. Being female. Having a family history of migraine headaches. Being Caucasian. Having a mental health condition, such as depression or anxiety. Being obese. What are the signs or symptoms? The main symptom of this condition is pulsating or throbbing pain. This pain may: Happen in any area of the head, such as on one side or both sides. Interfere with daily activities. Get worse with physical activity. Get worse with exposure to bright lights or loud noises. Other symptoms may include: Nausea. Vomiting. Dizziness. General sensitivity to bright lights, loud noises, or smells. Before you get a migraine headache, you may get warning signs (an aura). An aura may include: Seeing flashing lights or having blind spots. Seeing bright spots, halos, or zigzag lines. Having tunnel vision or blurred vision. Having numbness or a tingling feeling. Having trouble talking. Having muscle weakness. Some people have symptoms after a migraine headache (postdromal phase), such as: Feeling tired. Difficulty concentrating. How is this diagnosed? A migraine headache can be diagnosed based on: Your symptoms. A physical exam. Tests, such as: CT scan or an MRI of  the head. These imaging tests can help rule out other causes of headaches. Taking fluid from the spine (lumbar  puncture) and analyzing it (cerebrospinal fluid analysis, or CSF analysis). How is this treated? This condition may be treated with medicines that: Relieve pain. Relieve nausea. Prevent migraine headaches. Treatment for this condition may also include: Acupuncture. Lifestyle changes like avoiding foods that trigger migraine headaches. Biofeedback. Cognitive behavioral therapy. Follow these instructions at home: Medicines Take over-the-counter and prescription medicines only as told by your health care provider. Ask your health care provider if the medicine prescribed to you: Requires you to avoid driving or using heavy machinery. Can cause constipation. You may need to take these actions to prevent or treat constipation: Drink enough fluid to keep your urine pale yellow. Take over-the-counter or prescription medicines. Eat foods that are high in fiber, such as beans, whole grains, and fresh fruits and vegetables. Limit foods that are high in fat and processed sugars, such as fried or sweet foods. Lifestyle Do not drink alcohol. Do not use any products that contain nicotine or tobacco, such as cigarettes, e-cigarettes, and chewing tobacco. If you need help quitting, ask your health care provider. Get at least 8 hours of sleep every night. Find ways to manage stress, such as meditation, deep breathing, or yoga. General instructions   Keep a journal to find out what may trigger your migraine headaches. For example, write down: What you eat and drink. How much sleep you get. Any change to your diet or medicines. If you have a migraine headache: Avoid things that make your symptoms worse, such as bright lights. It may help to lie down in a dark, quiet room. Do not drive or use heavy machinery. Ask your health care provider what activities are safe for you while you are experiencing symptoms. Keep all follow-up visits as told by your health care provider. This is important. Contact a  health care provider if: You develop symptoms that are different or more severe than your usual migraine headache symptoms. You have more than 15 headache days in one month. Get help right away if: Your migraine headache becomes severe. Your migraine headache lasts longer than 72 hours. You have a fever. You have a stiff neck. You have vision loss. Your muscles feel weak or like you cannot control them. You start to lose your balance often. You have trouble walking. You faint. You have a seizure. Summary A migraine headache is an intense, throbbing pain on one side or both sides of the head. Migraines may also cause other symptoms, such as nausea, vomiting, and sensitivity to light and noise. This condition may be treated with medicines and lifestyle changes. You may also need to avoid certain things that trigger a migraine headache. Keep a journal to find out what may trigger your migraine headaches. Contact your health care provider if you have more than 15 headache days in a month or you develop symptoms that are different or more severe than your usual migraine headache symptoms. This information is not intended to replace advice given to you by your health care provider. Make sure you discuss any questions you have with your health care provider. Document Revised: 06/15/2018 Document Reviewed: 04/05/2018 Elsevier Patient Education  2022 [employer].

## 2021-01-05 LAB — COMPREHENSIVE METABOLIC PANEL
ALT: 22 IU/L (ref 0–32)
AST: 22 IU/L (ref 0–40)
Albumin/Globulin Ratio: 1.8 (ref 1.2–2.2)
Albumin: 4.3 g/dL (ref 3.9–5.0)
Alkaline Phosphatase: 69 IU/L (ref 44–121)
BUN/Creatinine Ratio: 16 (ref 9–23)
BUN: 11 mg/dL (ref 6–20)
Bilirubin Total: 0.5 mg/dL (ref 0.0–1.2)
CO2: 22 mmol/L (ref 20–29)
Calcium: 9.4 mg/dL (ref 8.7–10.2)
Chloride: 103 mmol/L (ref 96–106)
Creatinine, Ser: 0.69 mg/dL (ref 0.57–1.00)
Globulin, Total: 2.4 g/dL (ref 1.5–4.5)
Glucose: 85 mg/dL (ref 70–99)
Potassium: 4.5 mmol/L (ref 3.5–5.2)
Sodium: 137 mmol/L (ref 134–144)
Total Protein: 6.7 g/dL (ref 6.0–8.5)
eGFR: 127 mL/min/{1.73_m2} (ref >59)

## 2021-01-05 LAB — CBC WITH DIFFERENTIAL/PLATELET
Basophils Absolute: 0.1 10*3/uL (ref 0.0–0.2)
Basos: 1 %
EOS (ABSOLUTE): 0.3 10*3/uL (ref 0.0–0.4)
Eos: 3 %
Hematocrit: 39.3 % (ref 34.0–46.6)
Hemoglobin: 13 g/dL (ref 11.1–15.9)
Immature Grans (Abs): 0 10*3/uL (ref 0.0–0.1)
Immature Granulocytes: 0 %
Lymphocytes Absolute: 3.7 10*3/uL — ABNORMAL HIGH (ref 0.7–3.1)
Lymphs: 43 %
MCH: 28.1 pg (ref 26.6–33.0)
MCHC: 33.1 g/dL (ref 31.5–35.7)
MCV: 85 fL (ref 79–97)
Monocytes Absolute: 0.6 10*3/uL (ref 0.1–0.9)
Monocytes: 7 %
Neutrophils Absolute: 4 10*3/uL (ref 1.4–7.0)
Neutrophils: 46 %
Platelets: 302 10*3/uL (ref 150–450)
RBC: 4.63 x10E6/uL (ref 3.77–5.28)
RDW: 12.4 % (ref 11.7–15.4)
WBC: 8.6 10*3/uL (ref 3.4–10.8)

## 2021-01-05 LAB — TSH: TSH: 4.17 u[IU]/mL (ref 0.450–4.500)

## 2021-01-18 ENCOUNTER — Ambulatory Visit: Payer: Medicaid Other | Admitting: Neurology

## 2021-04-15 ENCOUNTER — Ambulatory Visit: Payer: Medicaid Other | Admitting: Radiology

## 2021-04-16 ENCOUNTER — Ambulatory Visit: Payer: Medicaid Other | Admitting: Radiology

## 2021-04-16 DIAGNOSIS — Z0289 Encounter for other administrative examinations: Secondary | ICD-10-CM

## 2021-04-20 ENCOUNTER — Encounter: Payer: Self-pay | Admitting: Radiology

## 2021-04-20 ENCOUNTER — Other Ambulatory Visit: Payer: Self-pay

## 2021-04-20 ENCOUNTER — Ambulatory Visit (INDEPENDENT_AMBULATORY_CARE_PROVIDER_SITE_OTHER): Payer: Medicaid Other | Admitting: Radiology

## 2021-04-20 VITALS — BP 120/76

## 2021-04-20 DIAGNOSIS — Z30431 Encounter for routine checking of intrauterine contraceptive device: Secondary | ICD-10-CM | POA: Diagnosis not present

## 2021-04-20 NOTE — Progress Notes (Signed)
° ° ° °  History:  21 y.o. G0P0000 here today for today for IUD string check; Kyleena IUD was placed  01/27/21. No complaints about the IUD, no concerning side effects.  The following portions of the patient's history were reviewed and updated as appropriate: allergies, current medications, past family history, past medical history, past social history, past surgical history and problem list. .  Review of Systems:  Pertinent items are noted in HPI.   Objective:  Physical Exam Blood pressure 120/76. Gen: NAD Abd: Soft, nontender and nondistended Pelvic: Normal appearing external genitalia; normal appearing vaginal mucosa and cervix.  IUD strings visualized, about 3 cm in length outside cervix.  Chaperone offered and declined.  Assessment & Plan:  Normal IUD check. Patient may keep IUD in place for up to 5 years. May remove sooner  if she desires pregnancy, or has side effects within that time. Follow up for AEX 8/23  [place]

## 2021-05-03 NOTE — Progress Notes (Unsigned)
PATIENT: Elizabeth Lucero DOB: [birth year]  REASON FOR VISIT: follow up HISTORY FROM: patient  Virtual Visit via Telephone Note  I connected with Elizabeth Lucero on 05/03/21 at  8:00 AM EST by telephone and verified that I am speaking with the correct person using two identifiers.   I discussed the limitations, risks, security and privacy concerns of performing an evaluation and management service by telephone and the availability of in person appointments. I also discussed with the patient that there may be a patient responsible charge related to this service. The patient expressed understanding and agreed to proceed.   History of Present Illness:  05/03/21 ALL: Elizabeth Lucero is a 21 y.o. female here today for follow up. 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    History (copied from Dr Trevor Mace previous note)  HPI:  Elizabeth Lucero is a 21 y.o. female here as requested by Velvet Bathe, MD for frequent headaches. PMHx Marfan Syndrome. Surgery of right orbital floor,nasal fracture. It is in both eyes, different flashes of color, same thing over and over, followed by head pain which is holocephalic, eyes hurt, pounding/pulsating/throbbing, photo/phonophobia, nausea, vomiting, moving makes it hurt worse, a dark room helps, No known triggers, weather doesn't trigger, caffeine withdrawal may be a trigger, not hormonal around periods, occur during the day, sleeping helps, not positional or exertional, no changes in quality, she always has migraines never has regular headaches. They can last up to 24 hours at least 4 days a month and some months can have a headache for 7 days straight, no other headaches. They can be severe 10/10. No other focal neurologic deficits, associated symptoms, inciting events or modifiable factors.She states her mother had migraine with aura/ocular migraines. She has had these  since 5th great. She has an alteration in vision.    Reviewed notes, labs and imaging from outside physicians, which showed:   CT Head 04/30/2016:    Ct showed No acute intracranial abnormalities including mass lesion or mass effect, hydrocephalus, extra-axial fluid collection, midline shift, hemorrhage, or acute infarction, large ischemic events (personally reviewed images)     MRI brain: 05/06/2010: FINDINGS: Brain: Ventricles, cisterns and other CSF spaces are within normal. There is no mass, mass effect, shift of midline structures or acute hemorrhage. No evidence of acute infarction.  Vascular: Within normal.  Skull: Within normal.  Sinuses/Orbits: Within normal.  Other: None.  IMPRESSION: No acute intracranial findings. (By my review also some non-specific chronic frontal white matter changes likely microvascular ischemia or sequelae of migraines)   From a thorough review of records, medications tried that can be used in headache and migraine management include: Atenolol, Fioricet, Benadryl, ibuprofen, ketorolac injections, Decadron injections, Zofran tablet and injection, Compazine injection, scopolamine patch, tramadol, ubrelvy(helped) samples, sertraline, amitriptyline/nortrip contraindicated due to being on another SSRI sertraline. Never tried Topiramate.   Observations/Objective:  Generalized: Well developed, in no acute distress  Mentation: Alert oriented to time, place, history taking. Follows all commands speech and language fluent   Assessment and Plan:  21 y.o. 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 here with  No diagnosis found.  No orders of the defined types were placed in this encounter.   No orders of the defined types were placed in this  encounter.    Follow Up  Instructions:  I discussed the assessment and treatment plan with the patient. The patient was provided an opportunity to ask questions and all were answered. The patient agreed with the plan and demonstrated an understanding of the instructions.   The patient was advised to call back or seek an in-person evaluation if the symptoms worsen or if the condition fails to improve as anticipated.  I provided *** minutes of non-face-to-face time during this encounter. Patient located at their place of residence during Mychart visit. Provider is in the office.    Shawnie Dapper, NP

## 2021-05-03 NOTE — Patient Instructions (Incomplete)
Below is our plan: ? ?We will *** ? ?Please make sure you are staying well hydrated. I recommend 50-60 ounces daily. Well balanced diet and regular exercise encouraged. Consistent sleep schedule with 6-8 hours recommended.  ? ?Please continue follow up with care team as directed.  ? ?Follow up with *** in *** ? ?You may receive a survey regarding today's visit. I encourage you to leave honest feed back as I do use this information to improve patient care. Thank you for seeing me today!  ? ?Migraine with aura: There is increased risk for stroke in women with migraine with aura and a contraindication for the combined contraceptive pill for use by women who have migraine with aura. The risk for women with migraine without aura is lower. However other risk factors like smoking are far more likely to increase stroke risk than migraine. There is a recommendation for no smoking and for the use of OCPs without estrogen such as progestogen only pills particularly for women with migraine with aura.. People who have migraine headaches with auras may be 3 times more likely to have a stroke caused by a blood clot, compared to migraine patients who don't see auras. Women who take hormone-replacement therapy may be 30 percent more likely to suffer a clot-based stroke than women not taking medication containing estrogen. Other risk factors like smoking and high blood pressure may be  much more important.  ? ?Magnesium: may help with headaches are aura, the best evidence for magnesium is for migraine with aura is its thought to stop the cortical spreading depression we believe is the pathophysiology of migraine aura. ? ?To prevent or relieve headaches, try the following: ?Cool Compress. Lie down and place a cool compress on your head.  ?Avoid headache triggers. If certain foods or odors seem to have triggered your migraines in the past, avoid them. A headache diary might help you identify triggers.  ?Include physical activity in  your daily routine. Try a daily walk or other moderate aerobic exercise.  ?Manage stress. Find healthy ways to cope with the stressors, such as delegating tasks on your to-do list.  ?Practice relaxation techniques. Try deep breathing, yoga, massage and visualization.  ?Eat regularly. Eating regularly scheduled meals and maintaining a healthy diet might help prevent headaches. Also, drink plenty of fluids.  ?Follow a regular sleep schedule. Sleep deprivation might contribute to headaches ?Consider biofeedback. With this mind-body technique, you learn to control certain bodily functions -- such as muscle tension, heart rate and blood pressure -- to prevent headaches or reduce headache pain.  ?  ?  ?Proceed to emergency room if you experience new or worsening symptoms or symptoms do not resolve, if you have new neurologic symptoms or if headache is severe, or for any concerning symptom.  ?

## 2021-05-04 ENCOUNTER — Telehealth: Payer: Medicaid Other | Admitting: Family Medicine

## 2021-05-04 DIAGNOSIS — G43109 Migraine with aura, not intractable, without status migrainosus: Secondary | ICD-10-CM

## 2021-06-22 ENCOUNTER — Ambulatory Visit: Payer: Medicaid Other | Admitting: Radiology

## 2021-06-22 DIAGNOSIS — Z0289 Encounter for other administrative examinations: Secondary | ICD-10-CM

## 2021-07-29 ENCOUNTER — Ambulatory Visit: Payer: Medicaid Other | Admitting: Radiology

## 2021-09-10 ENCOUNTER — Ambulatory Visit (INDEPENDENT_AMBULATORY_CARE_PROVIDER_SITE_OTHER): Payer: Medicaid Other | Admitting: Radiology

## 2021-09-10 VITALS — BP 124/76

## 2021-09-10 DIAGNOSIS — N898 Other specified noninflammatory disorders of vagina: Secondary | ICD-10-CM

## 2021-09-10 LAB — WET PREP FOR TRICH, YEAST, CLUE

## 2021-09-10 MED ORDER — METRONIDAZOLE 500 MG PO TABS: 500.0000 mg | ORAL_TABLET | Freq: Two times a day (BID) | ORAL | 0 refills | Status: DC

## 2021-09-10 NOTE — Addendum Note (Signed)
Addended by: Arlie Solomons on: 09/10/2021 03:47 PM   Modules accepted: Orders

## 2021-09-10 NOTE — Progress Notes (Signed)
      Subjective: ZEYNAB KLETT is a 21 y.o. female who complains of vaginal d/c with odor. Itching and irritation x 1 week. Has not had sex in 1 month, would like STI screen. Has irregular cycles with Kyleena.    Review of Systems  All other systems reviewed and are negative.    Objective:  -Vulva: without lesions or discharge -Vagina: discharge present, aptima swab and wet prep obtained -Cervix: no lesion or discharge, no CMT -Perineum: no lesions -Uterus: Mobile, non tender -Adnexa: no masses or tenderness   Microscopic wet-mount exam shows clue cells.   Chaperone offered and declined.  Assessment:/Plan:   1. Vaginal discharge +BV - WET PREP FOR TRICH, YEAST, CLUE - metroNIDAZOLE (FLAGYL) 500 MG tablet; Take 1 tablet (500 mg total) by mouth 2 (two) times daily.  Dispense: 14 tablet; Refill: 0    Will contact patient with results of testing completed today. Avoid intercourse until symptoms are resolved. Safe sex encouraged. Avoid the use of soaps or perfumed products in the peri area. Avoid tub baths and sitting in sweaty or wet clothing for prolonged periods of time.

## 2021-09-11 LAB — SURESWAB CT/NG/T. VAGINALIS
C. trachomatis RNA, TMA: NOT DETECTED
N. gonorrhoeae RNA, TMA: NOT DETECTED
Trichomonas vaginalis RNA: NOT DETECTED

## 2021-09-21 IMAGING — US US BREAST*R* LIMITED INC AXILLA
1 series · 7 of 7 positions shown · non-contrast
Comparison: Previous exam(s).

CLINICAL DATA: 19-year-old female presenting for short-term
follow-up of a probably benign right breast mass.

EXAM:
ULTRASOUND OF THE RIGHT BREAST

[Series 1: us breast*right* limited inc axilla · 0.07mm/px · 7 of 7 slices shown]
[im 1/7]
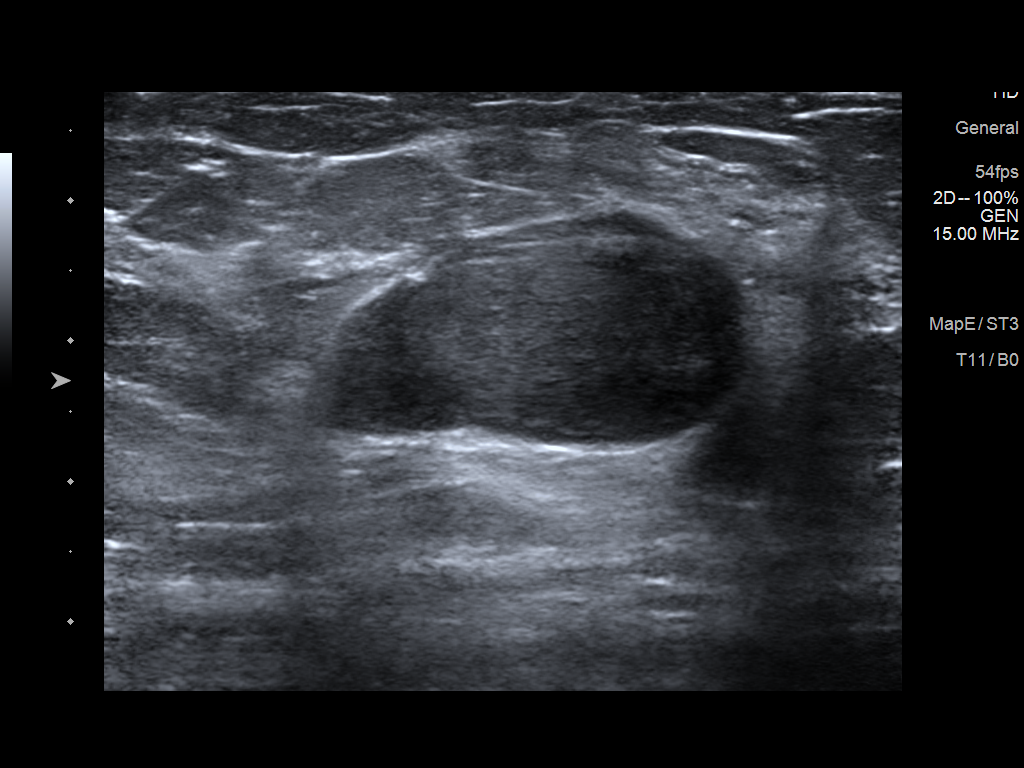
[im 2/7]
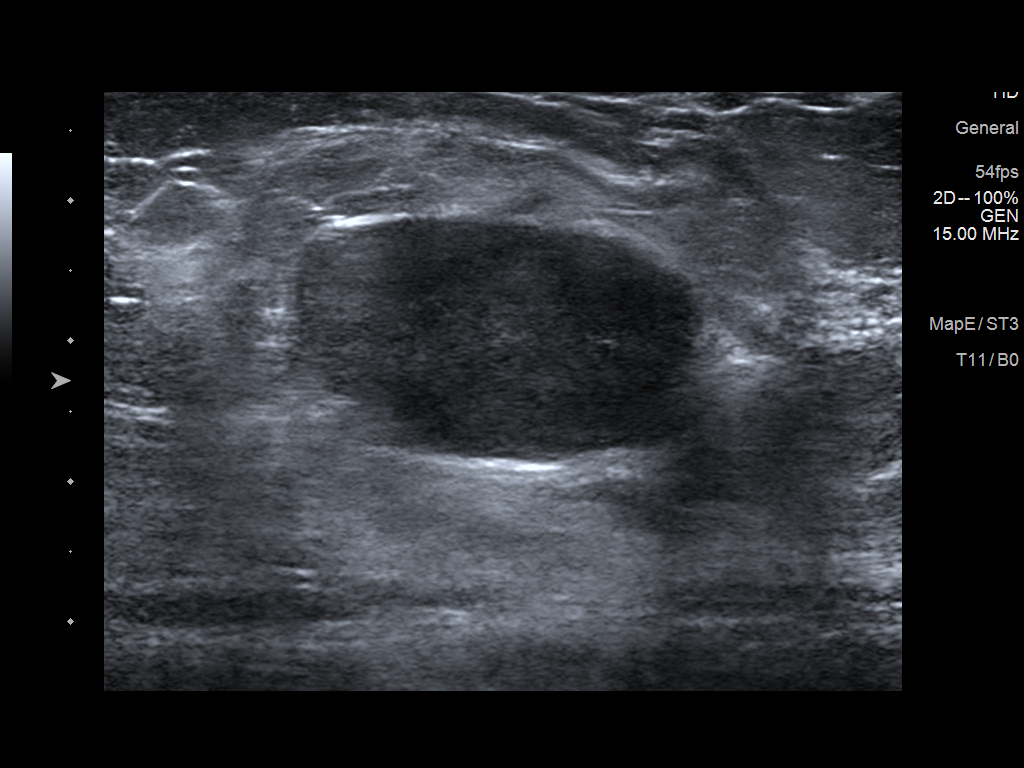
[im 3/7]
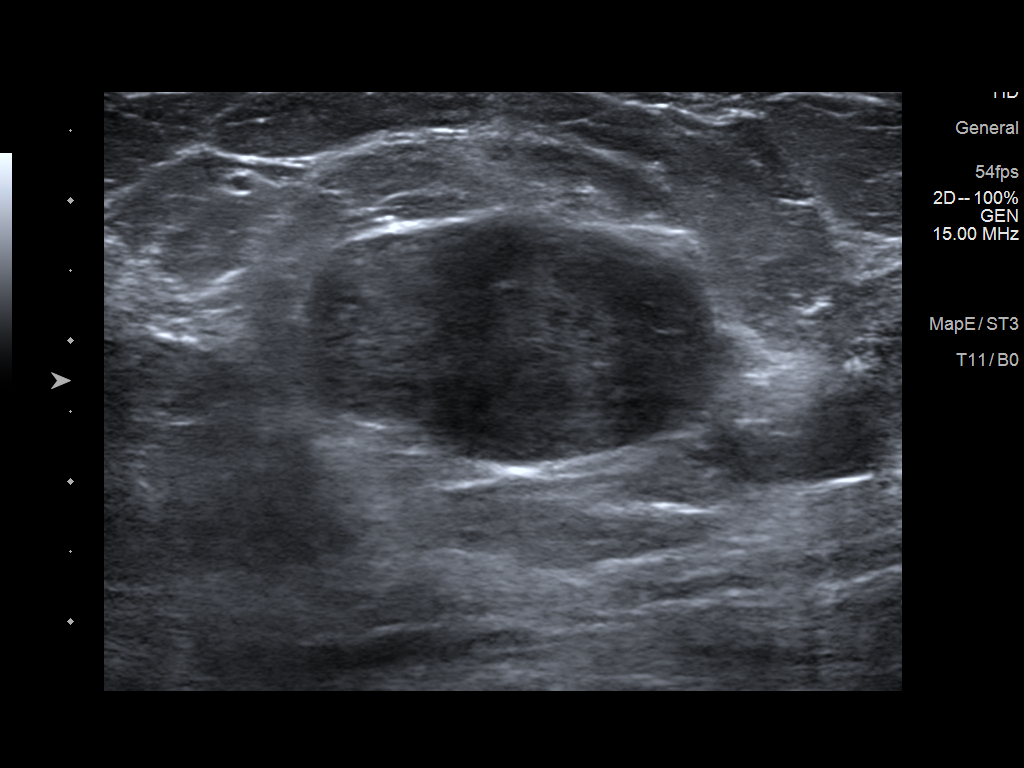
[im 4/7]
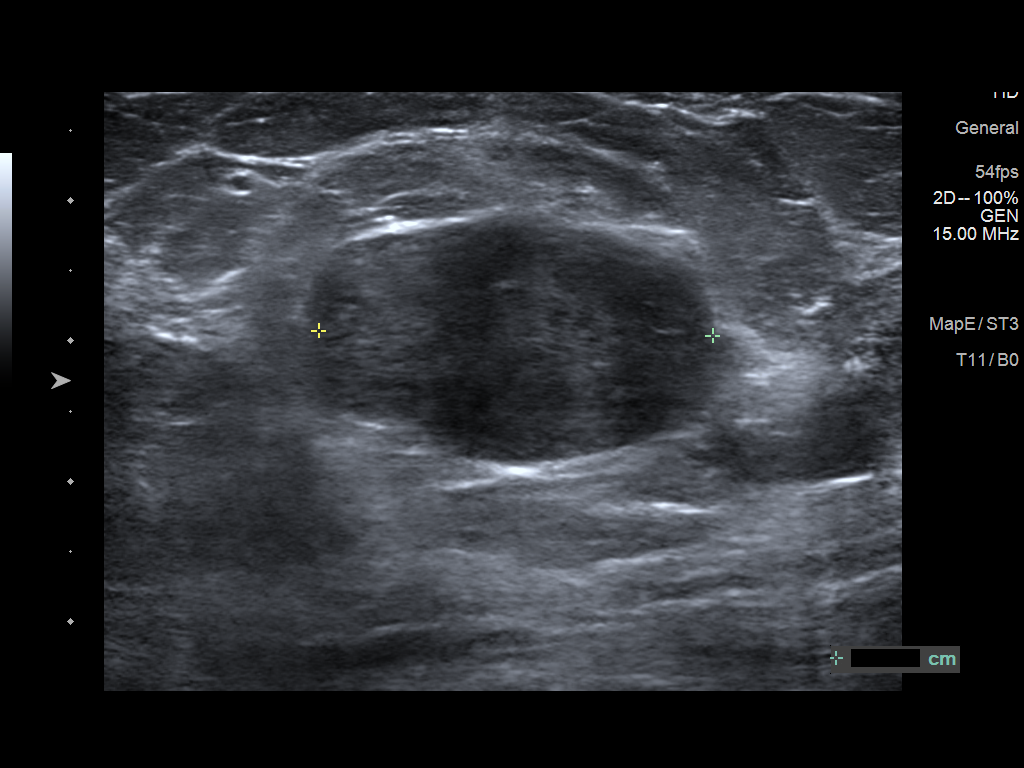
[im 5/7]
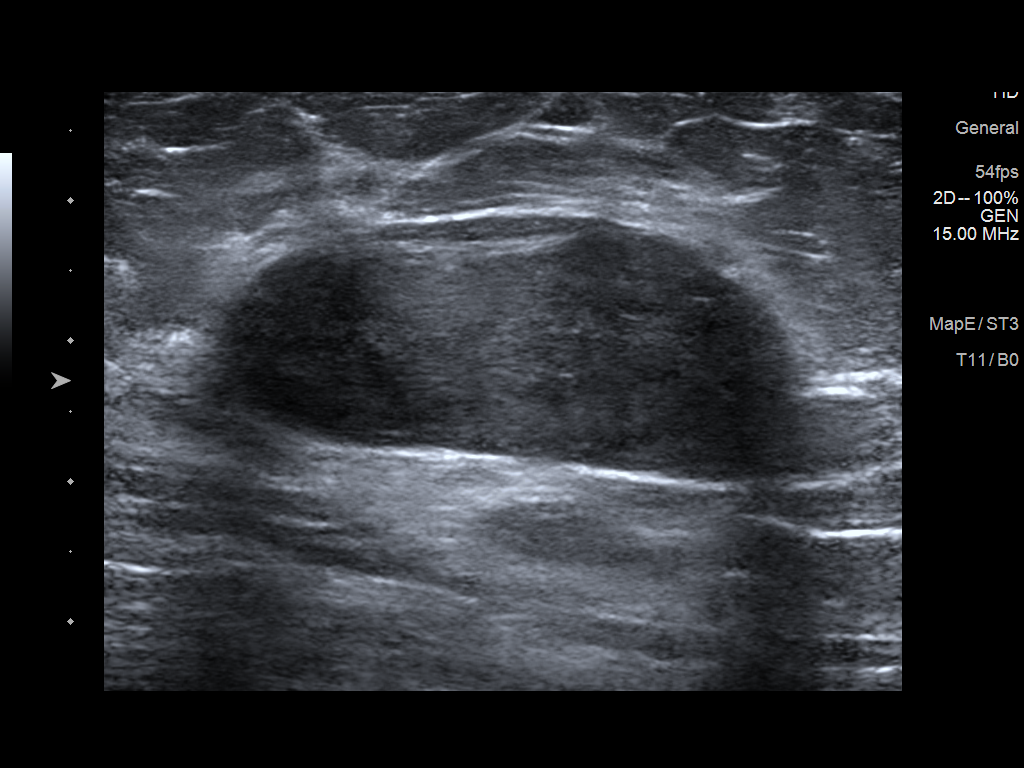
[im 6/7]
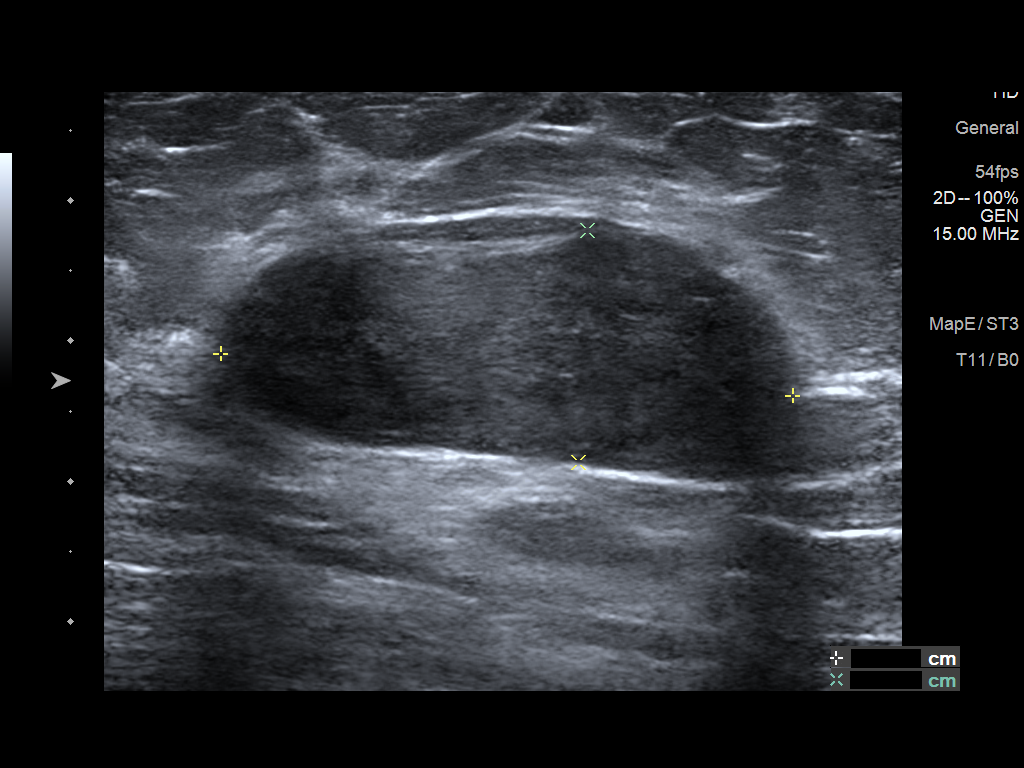
[im 7/7]
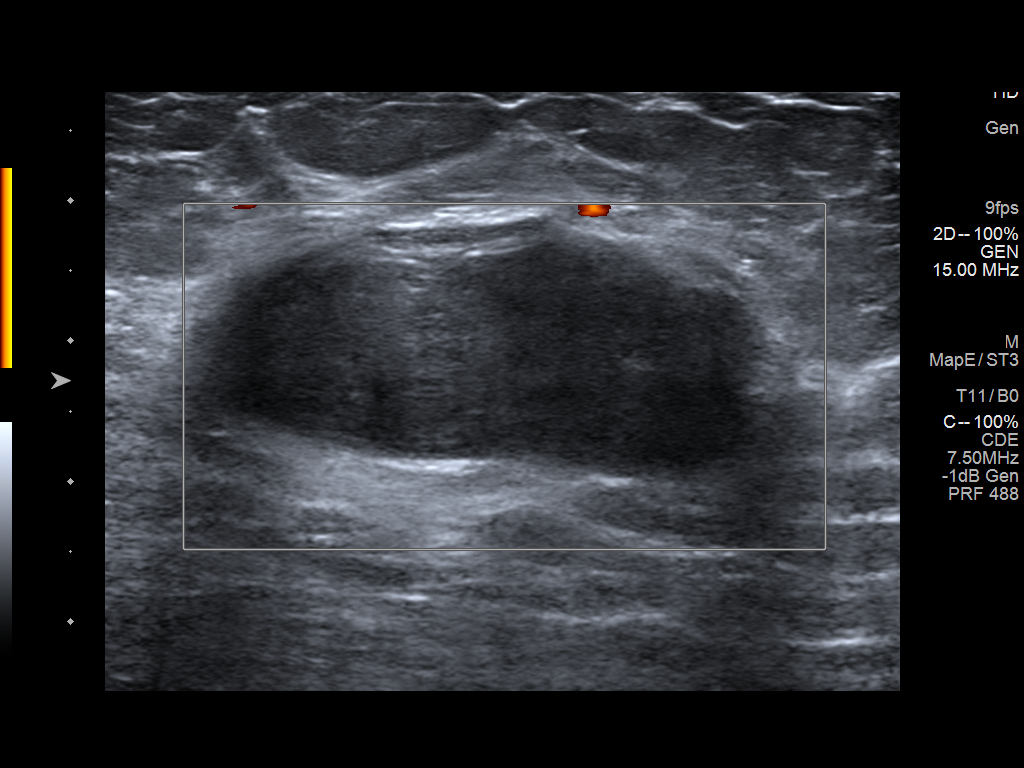

[7 of 7 positions shown; findings below may reference images not displayed]

FINDINGS: On physical exam, I palpate a discrete mobile mass in the upper
outer quadrant of the right breast.

Targeted ultrasound is performed in the right breast at 10 o'clock 4
cm from the nipple demonstrating an oval circumscribed hypoechoic
mass measuring 4.1 x 1.7 x 2.8 cm, originally measuring 3.0 x 1.3 x
2.1 cm in October 2018. No significant internal vascularity.
IMPRESSION: Interval growth of a right breast mass at 10 o'clock, likely a
fibroadenoma. Given growth of this mass, tissue sampling is
recommended.

RECOMMENDATION:
1. Ultrasound-guided core needle biopsy of the right breast mass at
10 o'clock.

2.  Surgical consultation for discussion about excision.

I have discussed the findings and recommendations with the patient
who agrees to proceed with biopsy. The patient will be scheduled for
biopsy prior to leaving our office today.

BI-RADS CATEGORY  4: Suspicious.

## 2021-09-23 IMAGING — US US  BREAST BX W/ LOC DEV 1ST LESION IMG BX SPEC US GUIDE*R*
1 series · 13 of 13 positions shown · non-contrast
Comparison: Previous exam(s).
COMPARISON: Previous exam(s).

Addendum:
CLINICAL DATA: 19-year-old female presenting for biopsy of a right
breast mass.

EXAM:
ULTRASOUND GUIDED RIGHT BREAST CORE NEEDLE BIOPSY

[Series 1: us breast bx w/ loc dev 1st lesion img bx spec us  · 0.07mm/px · 13 of 13 slices shown]
[im 1/13]
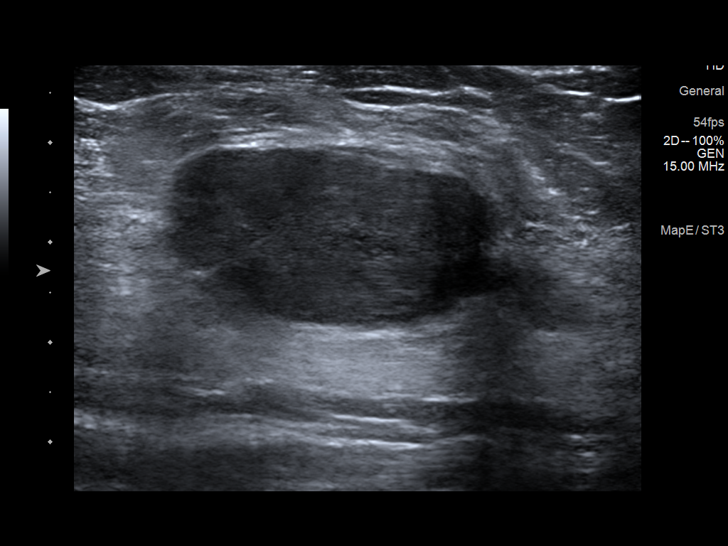
[im 2/13]
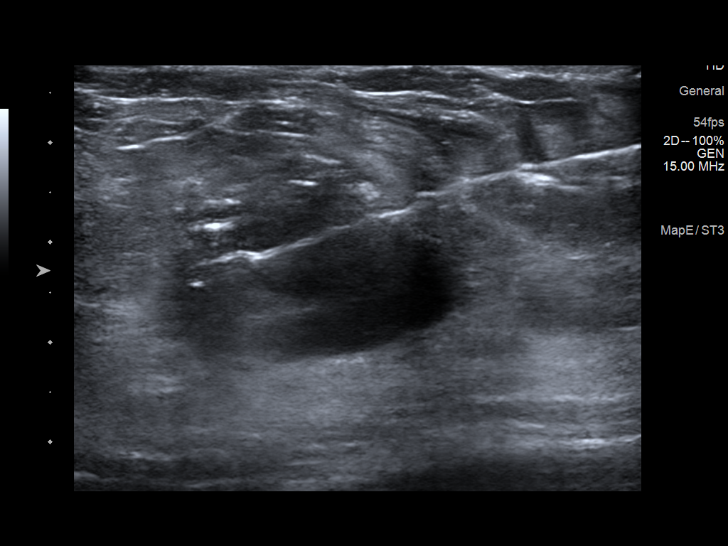
[im 3/13]
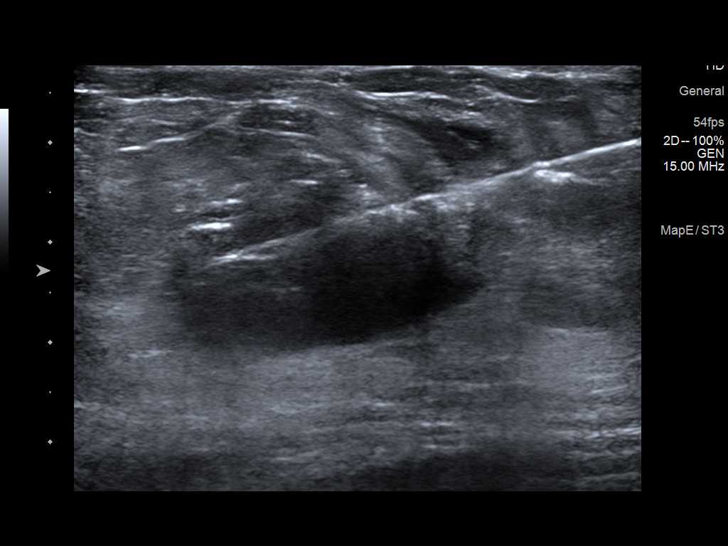
[im 4/13]
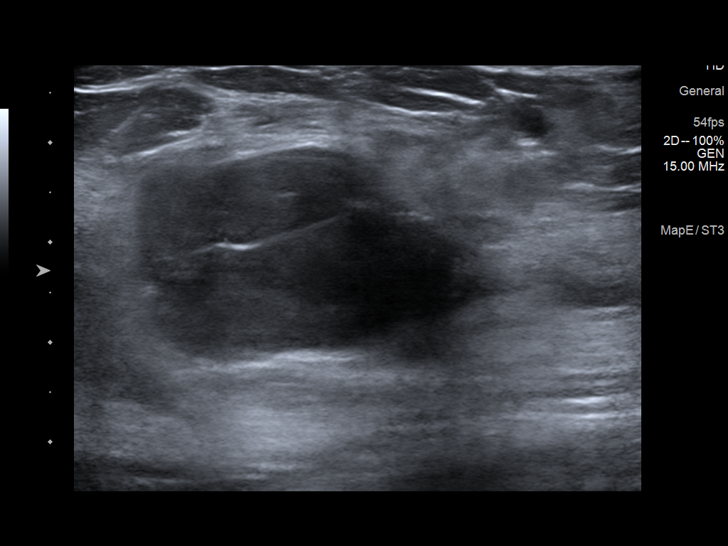
[im 5/13]
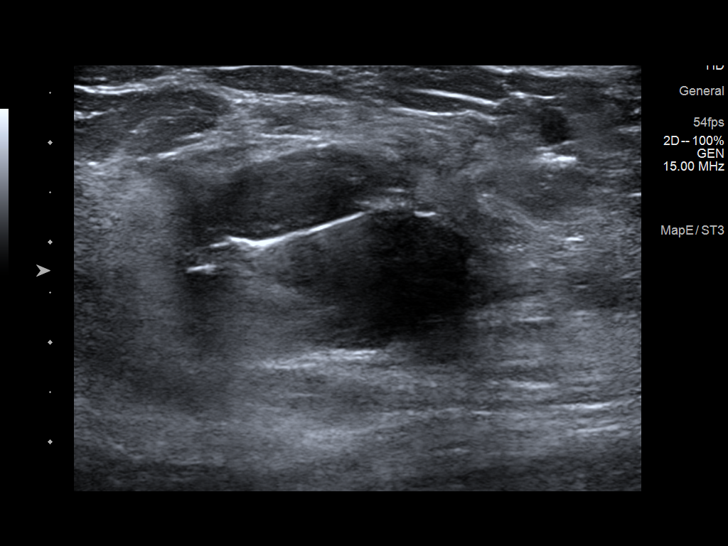
[im 6/13]
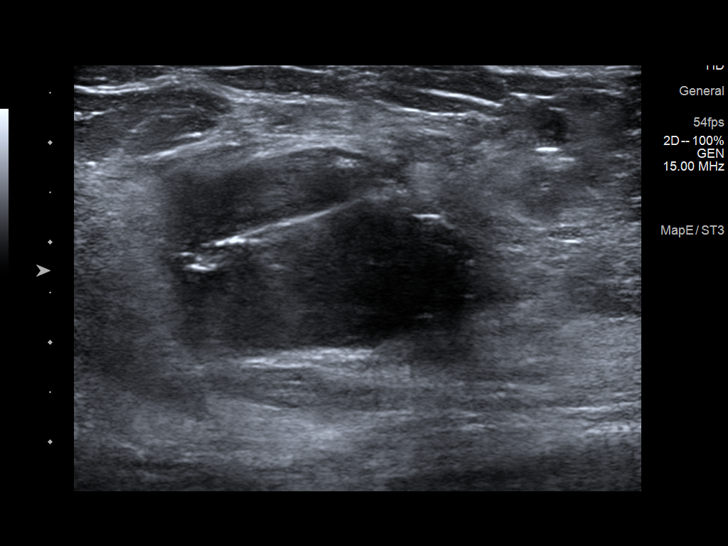
[im 7/13]
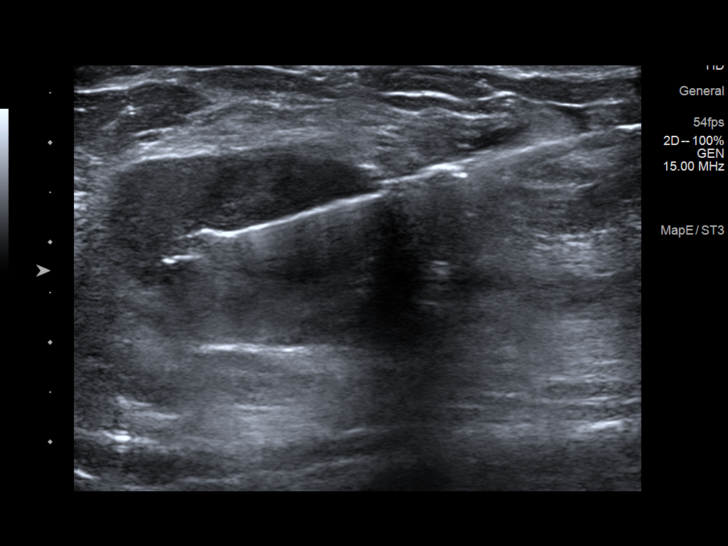
[im 8/13]
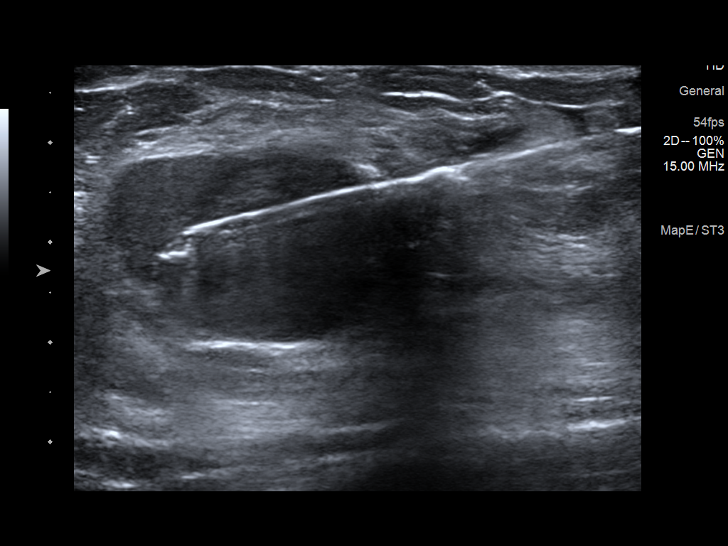
[im 9/13]
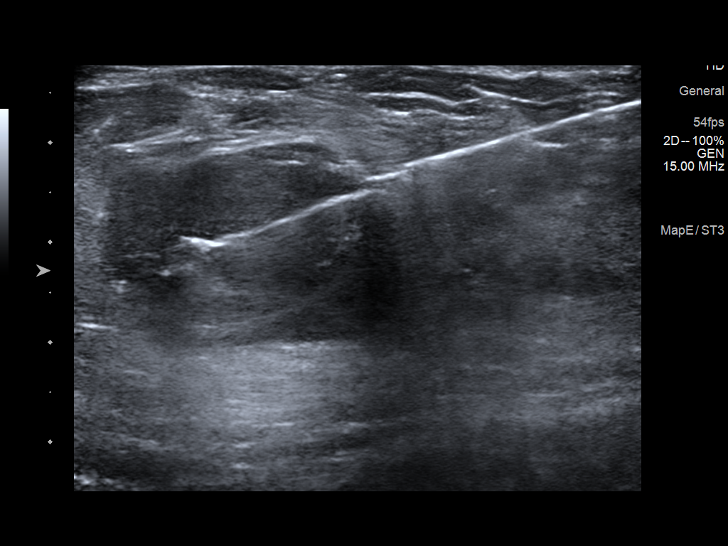
[im 10/13]
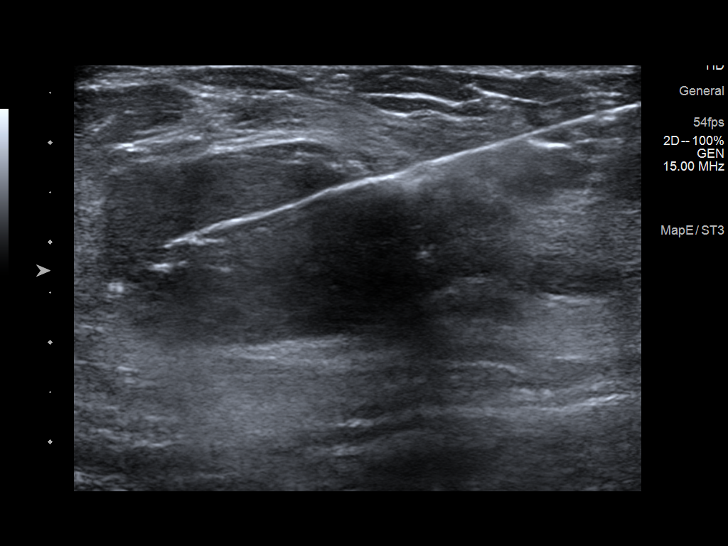
[im 11/13]
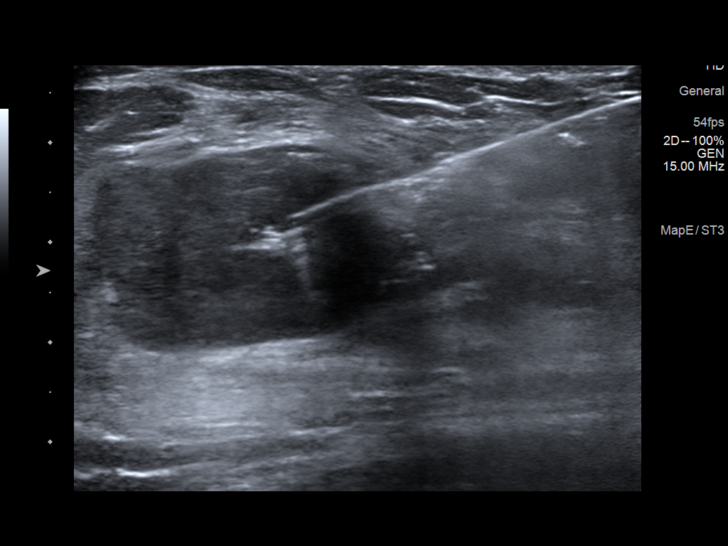
[im 12/13]
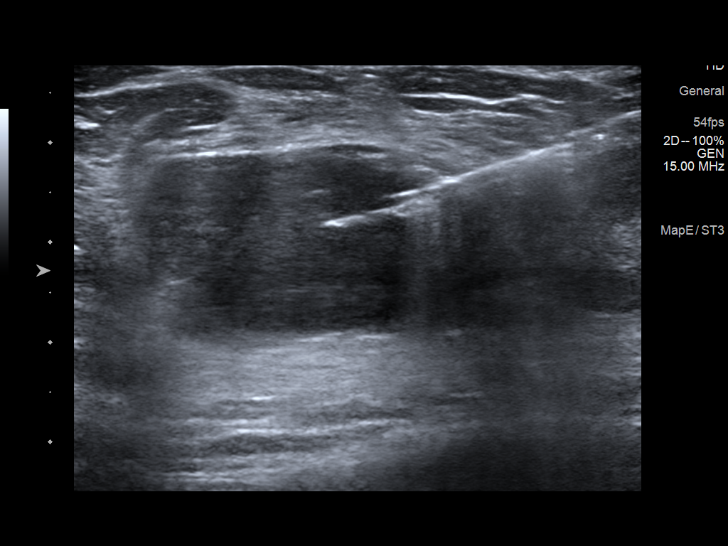
[im 13/13]
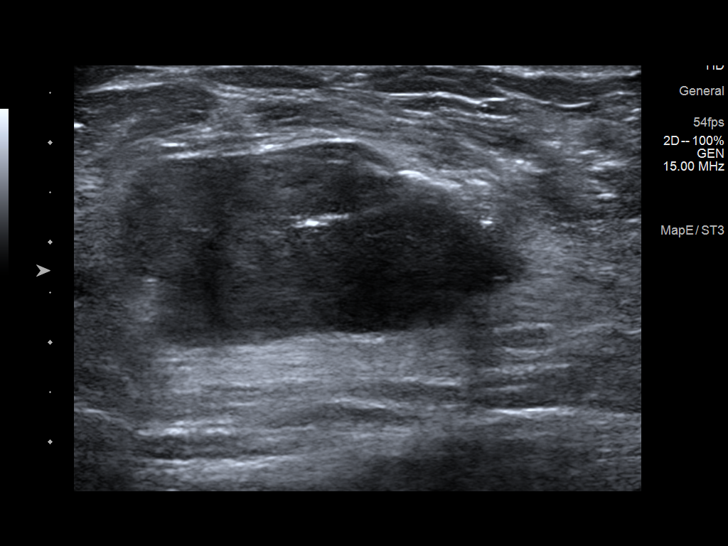

[13 of 13 positions shown; findings below may reference images not displayed]



Lesion quadrant: Upper outer quadrant

Using sterile technique and 1% Lidocaine as local anesthetic, under
direct ultrasound visualization, a 14 gauge Yoel device was
used to perform biopsy of a right breast mass at 10 o'clock using a
lateral approach. At the conclusion of the procedure a ribbon tissue
marker clip was deployed into the biopsy cavity. Follow up 2 view
mammogram was performed and dictated separately.
IMPRESSION: Ultrasound guided biopsy of a right breast mass at 10 o'clock. No
apparent complications.

ADDENDUM:
Pathology revealed FIBROADENOMA of the Right breast, 10 o'clock.
This was found to be concordant by Dr. Sandeep Largaespada with
surgical consultation for consideration of excision recommended.

. Pathology results were discussed with the patient by telephone.
The patient reported doing well after the biopsy with tenderness at
the site. Post biopsy instructions and care were reviewed and
questions were answered. The patient was encouraged to call The
direct phone number was provided.

Surgical consultation has been arranged with Dr. Abulho Usman Lynee at
[REDACTED] on May 19, 2020.

Pathology results reported by Elona Bold, RN on 04/23/2020.



Lesion quadrant: Upper outer quadrant

Using sterile technique and 1% Lidocaine as local anesthetic, under
direct ultrasound visualization, a 14 gauge Yoel device was
used to perform biopsy of a right breast mass at 10 o'clock using a
lateral approach. At the conclusion of the procedure a ribbon tissue
marker clip was deployed into the biopsy cavity. Follow up 2 view
mammogram was performed and dictated separately.
IMPRESSION: Ultrasound guided biopsy of a right breast mass at 10 o'clock. No
apparent complications.

## 2021-11-04 ENCOUNTER — Ambulatory Visit: Payer: Medicaid Other | Admitting: Radiology

## 2021-11-09 ENCOUNTER — Ambulatory Visit: Payer: Medicaid Other | Admitting: Radiology

## 2021-11-10 ENCOUNTER — Ambulatory Visit: Payer: Medicaid Other | Admitting: Radiology

## 2021-11-15 ENCOUNTER — Ambulatory Visit: Payer: Medicaid Other | Admitting: Radiology

## 2021-11-17 ENCOUNTER — Encounter: Payer: Self-pay | Admitting: Radiology

## 2021-11-17 ENCOUNTER — Ambulatory Visit (INDEPENDENT_AMBULATORY_CARE_PROVIDER_SITE_OTHER): Payer: Medicaid Other | Admitting: Radiology

## 2021-11-17 DIAGNOSIS — N761 Subacute and chronic vaginitis: Secondary | ICD-10-CM | POA: Diagnosis not present

## 2021-11-17 LAB — WET PREP FOR TRICH, YEAST, CLUE

## 2021-11-17 MED ORDER — METRONIDAZOLE 500 MG PO TABS: 500.0000 mg | ORAL_TABLET | Freq: Two times a day (BID) | ORAL | 0 refills | Status: DC

## 2021-11-17 MED ORDER — FLUCONAZOLE 150 MG PO TABS: 150.0000 mg | ORAL_TABLET | ORAL | 0 refills | Status: DC

## 2021-11-17 NOTE — Addendum Note (Signed)
Addended byWyline Beady on: 11/17/2021 03:29 PM   Modules accepted: Orders

## 2021-11-17 NOTE — Progress Notes (Signed)
      Subjective: Elizabeth Lucero is a 21 y.o. female who complains of increased vaginal discharge with odor. No urinary symptoms or itching. No new partners.     Review of Systems  Past Medical History:  Diagnosis Date   Acute medial meniscus tear of right knee 09/24/2015   Allergy    Seasonal    Marfan syndrome    treated by [facility] ped Leona Singleton and [facility] card Dr Elizebeth Brooking   Marfan syndrome    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/24/2015   Wears glasses       Objective:  There were no vitals filed for this visit. There is no height or weight on file to calculate BMI.   -General: no acute distress -Vulva: without lesions or discharge -Vagina: discharge present, wet prep obtained -Cervix: no lesion or discharge, no CMT. IUD strings seen -Perineum: no lesions -Uterus: Mobile, non tender -Adnexa: no masses or tenderness   Microscopic wet-mount exam shows KOH done, clue cells, hyphae.   Chaperone offered and declined.  Assessment:/Plan:  1. Subacute vaginitis  - fluconazole (DIFLUCAN) 150 MG tablet; Take 1 tablet (150 mg total) by mouth every 3 (three) days.  Dispense: 2 tablet; Refill: 0 - metroNIDAZOLE (FLAGYL) 500 MG tablet; Take 1 tablet (500 mg total) by mouth 2 (two) times daily.  Dispense: 14 tablet; Refill: 0   Avoid intercourse until symptoms are resolved. Safe sex encouraged. Avoid the use of soaps or perfumed products in the peri area. Avoid tub baths and sitting in sweaty or wet clothing for prolonged periods of time.

## 2022-01-25 ENCOUNTER — Ambulatory Visit: Payer: Medicaid Other | Admitting: Radiology

## 2022-02-03 ENCOUNTER — Ambulatory Visit: Payer: Medicaid Other | Admitting: Radiology

## 2022-02-10 ENCOUNTER — Ambulatory Visit (INDEPENDENT_AMBULATORY_CARE_PROVIDER_SITE_OTHER): Payer: BC Managed Care – PPO | Admitting: Radiology

## 2022-02-10 ENCOUNTER — Other Ambulatory Visit (HOSPITAL_COMMUNITY)
Admission: RE | Admit: 2022-02-10 | Discharge: 2022-02-10 | Disposition: A | Discharge status: Home / Self Care | Payer: BC Managed Care – PPO | Source: Ambulatory Visit | Attending: Radiology | Admitting: Radiology

## 2022-02-10 ENCOUNTER — Encounter: Payer: Self-pay | Admitting: Radiology

## 2022-02-10 VITALS — BP 110/68 | Ht 66.0 in | Wt 217.0 lb

## 2022-02-10 DIAGNOSIS — Z113 Encounter for screening for infections with a predominantly sexual mode of transmission: Secondary | ICD-10-CM | POA: Insufficient documentation

## 2022-02-10 DIAGNOSIS — Z01419 Encounter for gynecological examination (general) (routine) without abnormal findings: Secondary | ICD-10-CM | POA: Diagnosis not present

## 2022-02-10 NOTE — Progress Notes (Signed)
   Elizabeth Lucero 2000-08-25 [id number]   History:  21 y.o. G0 presents for annual exam. No gyn concerns, doing well with IUD. Working as a Engineer, civil (consulting) in the NICU at [employer]  Gynecologic History No LMP recorded. (Menstrual status: IUD).   Contraception/Family planning: IUD Sexually active: yes Last Pap: 2022. Results were: abnormal ASCUS   Obstetric History OB History  Gravida Para Term Preterm AB Living  0 0 0 0 0 0  SAB IAB Ectopic Multiple Live Births  0 0 0 0 0     The following portions of the patient's history were reviewed and updated as appropriate: allergies, current medications, past family history, past medical history, past social history, past surgical history, and problem list.  Review of Systems Pertinent items noted in HPI and remainder of comprehensive ROS otherwise negative.   Past medical history, past surgical history, family history and social history were all reviewed and documented in the EPIC chart.   Exam:  Vitals:   02/10/22 1038  BP: 110/68  Weight: 217 lb (98.4 kg)  Height: 5\' 6"  (1.676 m)   Body mass index is 35.02 kg/m.  General appearance:  Normal Thyroid:  Symmetrical, normal in size, without palpable masses or nodularity. Respiratory  Auscultation:  Clear without wheezing or rhonchi Cardiovascular  Auscultation:  Regular rate, without rubs, murmurs or gallops  Edema/varicosities:  Not grossly evident Abdominal  Soft,nontender, without masses, guarding or rebound.  Liver/spleen:  No organomegaly noted  Hernia:  None appreciated  Skin  Inspection:  Grossly normal Breasts: Examined lying and sitting.   Right: Without masses, retractions, nipple discharge or axillary adenopathy.   Left: Without masses, retractions, nipple discharge or axillary adenopathy. Genitourinary   Inguinal/mons:  Normal without inguinal adenopathy  External genitalia:  Normal appearing vulva with no masses, tenderness, or lesions  BUS/Urethra/Skene's glands:   Normal without masses or exudate  Vagina:  Normal appearing with normal color and discharge, no lesions  Cervix:  Normal appearing without discharge or lesions. IUD strings seen  Uterus:  Normal in size, shape and contour.  Mobile, nontender  Adnexa/parametria:     Rt: Normal in size, without masses or tenderness.   Lt: Normal in size, without masses or tenderness.  Anus and perineum: Normal   Patient informed chaperone available to be present for breast and pelvic exam. Patient has requested no chaperone to be present. Patient has been advised what will be completed during breast and pelvic exam.   Assessment/Plan:   1. Well woman exam with routine gynecological exam - Cytology - PAP( [place])  2. Screening examination for STD (sexually transmitted disease) - Cytology - PAP( [place])     Discussed SBE, colonoscopy and DEXA screening as directed/appropriate. Recommend of exercise weekly, including weight bearing exercise. Encouraged the use of seatbelts and sunscreen. Return in 1 year for annual or as needed.   B WHNP-BC 11:04 AM 02/10/2022

## 2022-02-15 ENCOUNTER — Other Ambulatory Visit: Payer: Self-pay

## 2022-02-15 DIAGNOSIS — A599 Trichomoniasis, unspecified: Secondary | ICD-10-CM

## 2022-02-15 DIAGNOSIS — A749 Chlamydial infection, unspecified: Secondary | ICD-10-CM

## 2022-02-15 LAB — CYTOLOGY - PAP
Chlamydia: POSITIVE — AB
Comment: NEGATIVE
Comment: NEGATIVE
Comment: NORMAL
Neisseria Gonorrhea: NEGATIVE
Trichomonas: POSITIVE — AB

## 2022-02-15 MED ORDER — METRONIDAZOLE 500 MG PO TABS: 500.0000 mg | ORAL_TABLET | Freq: Two times a day (BID) | ORAL | 0 refills | Status: DC

## 2022-02-15 MED ORDER — DOXYCYCLINE HYCLATE 100 MG PO CAPS: 100.0000 mg | ORAL_CAPSULE | Freq: Two times a day (BID) | ORAL | 0 refills | Status: AC

## 2022-03-18 ENCOUNTER — Ambulatory Visit: Payer: BC Managed Care – PPO | Admitting: Radiology

## 2022-03-23 ENCOUNTER — Ambulatory Visit (INDEPENDENT_AMBULATORY_CARE_PROVIDER_SITE_OTHER): Payer: BC Managed Care – PPO | Admitting: Radiology

## 2022-03-23 VITALS — BP 100/66

## 2022-03-23 DIAGNOSIS — N761 Subacute and chronic vaginitis: Secondary | ICD-10-CM | POA: Diagnosis not present

## 2022-03-23 DIAGNOSIS — A599 Trichomoniasis, unspecified: Secondary | ICD-10-CM | POA: Diagnosis not present

## 2022-03-23 DIAGNOSIS — A749 Chlamydial infection, unspecified: Secondary | ICD-10-CM

## 2022-03-23 DIAGNOSIS — A64 Unspecified sexually transmitted disease: Secondary | ICD-10-CM

## 2022-03-23 NOTE — Progress Notes (Signed)
      Subjective: Elizabeth Lucero is a 22 y.o. female who complains of vaginal itching and discharge. Has a new partner. TOC for chlamydia and trich, treated 02/10/22.    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   -General: no acute distress -Vulva: without lesions or discharge -Vagina: discharge present, aptima swab obtained -Cervix: no lesion or discharge, no CMT -Perineum: no lesions -Uterus: Mobile, non tender -Adnexa: no masses or tenderness   Chaperone offered and declined.  Assessment:/Plan:   1. Trichimoniasis  - SureSwab Advanced Vaginitis Plus,TMA  2. Subacute vaginitis  - SureSwab Advanced Vaginitis Plus,TMA    Will contact patient with results of testing completed today. Avoid intercourse until symptoms are resolved. Safe sex encouraged. Avoid the use of soaps or perfumed products in the peri area. Avoid tub baths and sitting in sweaty or wet clothing for prolonged periods of time.

## 2022-03-24 LAB — SURESWAB® ADVANCED VAGINITIS PLUS,TMA
C. trachomatis RNA, TMA: NOT DETECTED
CANDIDA SPECIES: NOT DETECTED
Candida glabrata: NOT DETECTED
N. gonorrhoeae RNA, TMA: NOT DETECTED
SURESWAB(R) ADV BACTERIAL VAGINOSIS(BV),TMA: POSITIVE — AB
TRICHOMONAS VAGINALIS (TV),TMA: NOT DETECTED

## 2022-03-25 ENCOUNTER — Other Ambulatory Visit: Payer: Self-pay

## 2022-03-25 ENCOUNTER — Ambulatory Visit: Payer: BC Managed Care – PPO | Admitting: Radiology

## 2022-03-25 DIAGNOSIS — B9689 Other specified bacterial agents as the cause of diseases classified elsewhere: Secondary | ICD-10-CM

## 2022-03-25 MED ORDER — METRONIDAZOLE 500 MG PO TABS: 500.0000 mg | ORAL_TABLET | Freq: Two times a day (BID) | ORAL | 0 refills | Status: AC

## 2022-03-29 ENCOUNTER — Ambulatory Visit: Payer: BC Managed Care – PPO | Admitting: Radiology

## 2022-04-17 ENCOUNTER — Encounter: Payer: BC Managed Care – PPO | Admitting: Nurse Practitioner

## 2022-04-17 DIAGNOSIS — N760 Acute vaginitis: Secondary | ICD-10-CM

## 2022-04-17 NOTE — Progress Notes (Signed)
I have spent 5 minutes in review of e-visit questionnaire, review and updating patient chart, medical decision making and response to patient.  ° °Shallen Luedke W Doniesha Landau, NP ° °  °

## 2022-04-17 NOTE — Progress Notes (Signed)
Ms Swee due to you testing positive for chlamydia a few months ago we would not be able to treat you today for vaginitis. I would need to recommend you have formal testing at either the [facility], your PCP office or [facility] ([facility]) is offering STI testing, treatment, and PrEP services which are FREE to uninsured patients! No referral is needed - Just have the patient call [phone] Testing is by appointment - they typically get patients in [place] or NEXT DAY   NOTE: You will NOT be charged for this eVisit.  If you do not have a PCP, [facility] offers a free physician referral service available at [phone]. Our trained staff has the experience, knowledge and resources to put you in touch with a physician who is right for you.    If you are having a true medical emergency please call 911.   Your e-visit answers were reviewed by a board certified advanced clinical practitioner to complete your personal care plan.  Thank you for using e-Visits.

## 2022-04-22 ENCOUNTER — Other Ambulatory Visit: Payer: Self-pay

## 2022-04-22 ENCOUNTER — Other Ambulatory Visit (HOSPITAL_COMMUNITY)
Admission: RE | Admit: 2022-04-22 | Discharge: 2022-04-22 | Disposition: A | Discharge status: Home / Self Care | Payer: BC Managed Care – PPO | Source: Ambulatory Visit | Attending: Internal Medicine | Admitting: Internal Medicine

## 2022-04-22 ENCOUNTER — Ambulatory Visit (INDEPENDENT_AMBULATORY_CARE_PROVIDER_SITE_OTHER): Payer: BC Managed Care – PPO | Admitting: Internal Medicine

## 2022-04-22 ENCOUNTER — Encounter: Payer: Self-pay | Admitting: Internal Medicine

## 2022-04-22 VITALS — BP 124/83 | HR 61 | Temp 97.5°F | Resp 16 | Ht 67.0 in | Wt 219.0 lb

## 2022-04-22 DIAGNOSIS — Z113 Encounter for screening for infections with a predominantly sexual mode of transmission: Secondary | ICD-10-CM

## 2022-04-22 DIAGNOSIS — Z1159 Encounter for screening for other viral diseases: Secondary | ICD-10-CM | POA: Diagnosis not present

## 2022-04-22 DIAGNOSIS — N898 Other specified noninflammatory disorders of vagina: Secondary | ICD-10-CM

## 2022-04-22 MED ORDER — METRONIDAZOLE 500 MG PO TABS: 500.0000 mg | ORAL_TABLET | Freq: Two times a day (BID) | ORAL | 0 refills | Status: AC

## 2022-04-22 NOTE — Patient Instructions (Signed)
Will prescribe flagyl 7 day course today  Follow up with your gynecologist for other none antibiotics management if recurrent bv   Will call you if std screening positive   Otherwise no need to make any follow up appointment at this time

## 2022-04-22 NOTE — Progress Notes (Signed)
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 female patient with frequent bv, asked to come here for std testing  Patient has 5 days white vaginal disharge more than usual and foul odor No fever, chill No vaginal irritation No dysuria, urgency/frequency  Last had confirmed bv dx 1 month prior to this visit. Has had flagyl in the past and also boric acid vaginal cream  Has same partner of 6 months  No vaginal douching, gel/material   Patient has kyleena iud; it has been been in for a year. She thinks the bv episodes start since this was placed and she had discussed with her gynecologist. She has iud for birth control  She has migraine. 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   Wears glasses     Social History   Tobacco Use   Smoking status: Never    Passive exposure: Never   Smokeless tobacco: Never  Vaping Use   Vaping Use: Former   Devices: quit 2 yrs ago  Substance Use Topics   Alcohol use: Yes    Comment: socially   Drug use: No    Family History  Problem Relation  Age of Onset   Marfan syndrome Mother    Sjogren's syndrome Maternal Grandmother    Arthritis/Rheumatoid Maternal Grandmother    Diabetes type II Paternal Grandmother    Diabetes Other    Cancer - Colon Other     Allergies  Allergen Reactions   Codeine Itching   Milk-Related Compounds Nausea And Vomiting    Stomach upset with milk   Pseudoephedrine Hcl Itching    OBJECTIVE: Vitals:   04/22/22 0926  BP: 124/83  Pulse: 61  Resp: 16  Temp: (!) 97.5 F (36.4 C)  TempSrc: Oral  SpO2: 100%  Weight: 219 lb (99.3 kg)  Height: 5' 7"$  (1.702 m)   Body mass index is 34.3 kg/m.   Physical Exam General/constitutional: no distress, pleasant HEENT: Normocephalic, PER, Conj Clear, EOMI, Oropharynx clear Neck supple CV: rrr no mrg Lungs: clear to auscultation, normal respiratory effort Abd: Soft, Nontender Ext: no edema Skin: No Rash Neuro: nonfocal MSK: no peripheral joint swelling/tenderness/warmth; back spines nontender   Lab: Lab Results  Component Value Date   WBC 8.6 01/04/2021   HGB 13.0 01/04/2021   HCT 39.3 01/04/2021   MCV 85 01/04/2021   PLT 302 [id number]  Last metabolic panel Lab Results  Component Value Date   GLUCOSE  85 01/04/2021   NA 137 01/04/2021   K 4.5 01/04/2021   CL 103 01/04/2021   CO2 22 01/04/2021   BUN 11 01/04/2021   CREATININE 0.69 01/04/2021   EGFR 127 01/04/2021   CALCIUM 9.4 01/04/2021   PROT 6.7 01/04/2021   ALBUMIN 4.3 01/04/2021   LABGLOB 2.4 01/04/2021   AGRATIO 1.8 01/04/2021   BILITOT 0.5 01/04/2021   ALKPHOS 69 01/04/2021   AST 22 01/04/2021   ALT 22 01/04/2021   ANIONGAP 8 04/30/2016    Microbiology:  Serology:  Imaging:   Assessment/plan: Problem List Items Addressed This Visit   None Visit Diagnoses     Screening for STDs (sexually transmitted diseases)    -  Primary   Relevant Orders   Urine cytology ancillary only([place])   RPR   Fluorescent treponemal ab(fta)-IgG-bld   Hepatitis, Acute    HIV antibody (with reflex)   Vaginal discharge       Relevant Orders   Urine cytology ancillary only([place])   Need for hepatitis B screening test       Relevant Orders   Hepatitis, Acute   Need for hepatitis C screening test       Relevant Orders   Hepatitis, Acute         Vaginal discharge. Hx bv recurrent, in setting hormone iud placement a year prior  Stable partner of 6 months  Agree probably bv, but will screen for std Discuss natural history, pathogenesis and tx modalities for bv. Previously had responded well to flagyl  If recurrent bv f/u gynecology for consideration of vaginal boric acid. High relapse can be associated with bv in general   7 days flagyl given here  No need for id f/u. Will discuss with patient if std screening positive    I have spent a total of 65 minutes of face-to-face and non-face-to-face time, excluding clinical staff time, preparing to see patient, ordering tests and/or medications, and provide counseling the patient    Follow-up: No follow-ups on file.  Jabier Mutton, [facility] 04/22/2022, 9:41 AM

## 2022-04-25 LAB — URINE CYTOLOGY ANCILLARY ONLY
Chlamydia: NEGATIVE
Comment: NEGATIVE
Comment: NEGATIVE
Comment: NORMAL
Neisseria Gonorrhea: NEGATIVE
Trichomonas: NEGATIVE

## 2022-04-25 LAB — HIV ANTIBODY (ROUTINE TESTING W REFLEX): HIV 1&2 Ab, 4th Generation: NONREACTIVE

## 2022-04-25 LAB — RPR: RPR Ser Ql: NONREACTIVE

## 2022-04-25 LAB — HEPATITIS PANEL, ACUTE
Hep A IgM: NONREACTIVE
Hep B C IgM: NONREACTIVE
Hepatitis B Surface Ag: NONREACTIVE
Hepatitis C Ab: NONREACTIVE

## 2022-09-16 ENCOUNTER — Ambulatory Visit: Payer: BC Managed Care – PPO | Admitting: Radiology

## 2022-09-20 ENCOUNTER — Ambulatory Visit: Payer: BC Managed Care – PPO | Admitting: Radiology

## 2022-10-05 ENCOUNTER — Ambulatory Visit (INDEPENDENT_AMBULATORY_CARE_PROVIDER_SITE_OTHER): Payer: BC Managed Care – PPO | Admitting: Radiology

## 2022-10-05 VITALS — BP 108/70

## 2022-10-05 DIAGNOSIS — Z113 Encounter for screening for infections with a predominantly sexual mode of transmission: Secondary | ICD-10-CM

## 2022-10-05 NOTE — Progress Notes (Signed)
      Subjective: Elizabeth Lucero is a 22 y.o. female here for STI screening. Has a new partner, they are both getting tested before having sex. No symptoms. Happy with IUD, some brown spotting occasionally.  Review of Systems  All other systems reviewed and are negative.   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   -General: no acute distress -Vulva: without lesions or discharge -Vagina: discharge present, aptima swab obtained -Cervix: no lesion or discharge, no CMT -Perineum: no lesions -Uterus: Mobile, non tender -Adnexa: no masses or tenderness  [name], CMA present for exam  Assessment:/Plan:   1. Screening for STDs (sexually transmitted diseases) - SURESWAB CT/NG/T. vaginalis - HIV antibody (with reflex) - RPR - Hepatitis B Surface AntiGEN - Hepatitis C antibody    Will contact patient with results of testing completed today. Avoid intercourse until symptoms are resolved. Safe sex encouraged. Avoid the use of soaps or perfumed products in the peri area. Avoid tub baths and sitting in sweaty or wet clothing for prolonged periods of time.

## 2023-02-23 ENCOUNTER — Encounter: Payer: Self-pay | Admitting: Radiology

## 2023-02-23 ENCOUNTER — Other Ambulatory Visit (HOSPITAL_COMMUNITY)
Admission: RE | Admit: 2023-02-23 | Discharge: 2023-02-23 | Disposition: A | Discharge status: Home / Self Care | Payer: BC Managed Care – PPO | Source: Ambulatory Visit | Attending: Radiology | Admitting: Radiology

## 2023-02-23 ENCOUNTER — Ambulatory Visit: Payer: BC Managed Care – PPO | Admitting: Radiology

## 2023-02-23 VITALS — BP 112/76 | HR 76 | Ht 65.75 in | Wt 222.0 lb

## 2023-02-23 DIAGNOSIS — B9689 Other specified bacterial agents as the cause of diseases classified elsewhere: Secondary | ICD-10-CM

## 2023-02-23 DIAGNOSIS — Z975 Presence of (intrauterine) contraceptive device: Secondary | ICD-10-CM

## 2023-02-23 DIAGNOSIS — Z8619 Personal history of other infectious and parasitic diseases: Secondary | ICD-10-CM

## 2023-02-23 DIAGNOSIS — R87612 Low grade squamous intraepithelial lesion on cytologic smear of cervix (LGSIL): Secondary | ICD-10-CM | POA: Insufficient documentation

## 2023-02-23 DIAGNOSIS — Z113 Encounter for screening for infections with a predominantly sexual mode of transmission: Secondary | ICD-10-CM

## 2023-02-23 DIAGNOSIS — Z01419 Encounter for gynecological examination (general) (routine) without abnormal findings: Secondary | ICD-10-CM | POA: Diagnosis not present

## 2023-02-23 DIAGNOSIS — N898 Other specified noninflammatory disorders of vagina: Secondary | ICD-10-CM

## 2023-02-23 DIAGNOSIS — N760 Acute vaginitis: Secondary | ICD-10-CM | POA: Diagnosis not present

## 2023-02-23 LAB — WET PREP FOR TRICH, YEAST, CLUE

## 2023-02-23 MED ORDER — SOLOSEC 2 G PO PACK: 1.0000 | PACK | Freq: Once | ORAL | 0 refills | Status: AC

## 2023-02-23 NOTE — Patient Instructions (Signed)

## 2023-02-23 NOTE — Progress Notes (Signed)
Elizabeth Lucero [birth year] [id number]   History:  22 y.o. G0 presents for annual exam. C/o BV symptoms. Bleeding irregular with IUD. No other concerns. 1 new partner.  Gynecologic History Patient's last menstrual period was 02/05/2023 (approximate).   Contraception/Family planning: IUD Sexually active: yes, 1 new partner Last Pap: 2023. Results were: abnormal. LSIL   Obstetric History OB History  Gravida Para Term Preterm AB Living  0 0 0 0 0 0  SAB IAB Ectopic Multiple Live Births  0 0 0 0 0    The following portions of the patient's history were reviewed and updated as appropriate: allergies, current medications, past family history, past medical history, past social history, past surgical history, and problem list.  ROS  Past medical history, past surgical history, family history and social history were all reviewed and documented in the EPIC chart.  Exam:  Vitals:   02/23/23 1536  BP: 112/76  Pulse: 76  SpO2: 97%  Weight: 222 lb (100.7 kg)  Height: 5' 5.75" (1.67 m)   Body mass index is 36.1 kg/m.  Physical Exam Vitals and nursing note reviewed. Exam conducted with a chaperone present.  Constitutional:      Appearance: Normal appearance. She is normal weight.  HENT:     Head: Normocephalic and atraumatic.  Neck:     Thyroid: No thyroid mass, thyromegaly or thyroid tenderness.  Cardiovascular:     Rate and Rhythm: Regular rhythm.     Heart sounds: Normal heart sounds.  Pulmonary:     Effort: Pulmonary effort is normal.     Breath sounds: Normal breath sounds.  Chest:  Breasts:    Breasts are symmetrical.     Right: Normal. No inverted nipple, mass, nipple discharge, skin change or tenderness.     Left: Normal. No inverted nipple, mass, nipple discharge, skin change or tenderness.  Abdominal:     General: Abdomen is flat. Bowel sounds are normal.     Palpations: Abdomen is soft.  Genitourinary:    General: Normal vulva.     Vagina: Normal. No  vaginal discharge, bleeding or lesions.     Cervix: Normal. No discharge or lesion.     Uterus: Normal. Not enlarged and not tender.      Adnexa: Right adnexa normal and left adnexa normal.       Right: No mass, tenderness or fullness.         Left: No mass, tenderness or fullness.       Comments: IUD string seen Lymphadenopathy:     Upper Body:     Right upper body: No axillary adenopathy.     Left upper body: No axillary adenopathy.  Skin:    General: Skin is warm and dry.  Neurological:     Mental Status: She is alert and oriented to person, place, and time.  Psychiatric:        Mood and Affect: Mood normal.        Thought Content: Thought content normal.        Judgment: Judgment normal.      [name], CMA present for exam  Assessment/Plan:   1. Well woman exam with routine gynecological exam (Primary)  2. Vaginal discharge - WET PREP FOR TRICH, YEAST, CLUE  3. LGSIL on Pap smear of cervix - Cytology - PAP( [place])  4. Screening for STDs (sexually transmitted diseases)  5. History of chlamydia - Cytology - PAP( [place])  6. History of trichomoniasis - Cytology -  PAP( [place])  7. Bacterial vaginosis - Secnidazole (SOLOSEC) 2 g PACK; Take 1 packet by mouth once for 1 dose.  Dispense: 1 each; Refill: 0  8. IUD (intrauterine device) in place    Arlie Solomons B WHNP-BC 4:01 PM 02/23/2023

## 2023-02-24 ENCOUNTER — Telehealth: Payer: Self-pay

## 2023-02-24 NOTE — Telephone Encounter (Signed)
Spoke to patient, notified patient her insurance company denied the PA for [employer].  Patient was given options and she states she will use the discount coupon to purchase Solosec.

## 2023-02-24 NOTE — Telephone Encounter (Signed)
Prior Authorization for [employer] initiated through covermymeds.  Patient notified. KEY:  [id number]: N77.1 Patient has recurrent BV.  Patient has tried and failed Flagyl, Metrogel, Clindamycin.

## 2023-02-24 NOTE — Telephone Encounter (Signed)
Prior Authorization for [employer] was denied:  Denied. Per the health plan's preferred drug list, at least 2 preferred drugs must be tried before requesting this drug or tell us why the member cannot try any preferred alternatives. Please send [place] supporting chart notes and lab results. Here is list of preferred alternatives: vancomycin capsule (generic for Vancocin), vancomycin oral solution (generic for [place]). Per our records, the member has already tried metronidazole tablet (generic for Flagyl)  Sent to provider for review.

## 2023-02-24 NOTE — Telephone Encounter (Signed)
Neither of those will treat BV- not applicable.  Please reach out to patient and see what else she would like to try to treat. Elizabeth Lucero (one time gel), metrogel x5 nights, clindamycin oral 300mg  po BID x 7 days

## 2023-02-28 LAB — CYTOLOGY - PAP
Adequacy: ABSENT
Chlamydia: NEGATIVE
Comment: NEGATIVE
Comment: NEGATIVE
Comment: NORMAL
Diagnosis: NEGATIVE
Neisseria Gonorrhea: NEGATIVE
Trichomonas: NEGATIVE

## 2023-05-03 ENCOUNTER — Ambulatory Visit: Payer: 59 | Admitting: Radiology

## 2023-05-08 ENCOUNTER — Ambulatory Visit: Payer: 59 | Admitting: Obstetrics and Gynecology

## 2023-05-10 ENCOUNTER — Ambulatory Visit (INDEPENDENT_AMBULATORY_CARE_PROVIDER_SITE_OTHER): Admitting: Radiology

## 2023-05-10 VITALS — BP 106/70 | Wt 215.6 lb

## 2023-05-10 DIAGNOSIS — N898 Other specified noninflammatory disorders of vagina: Secondary | ICD-10-CM

## 2023-05-10 LAB — WET PREP FOR TRICH, YEAST, CLUE

## 2023-05-10 NOTE — Progress Notes (Signed)
      Subjective: Elizabeth Lucero is a 23 y.o. female who complains of vaginal odor (symptoms resolved a few days ago). Wants to discuss having IUD removed and other birth control options. No new partners, not using condoms.    Review of Systems  All other systems reviewed and are negative.   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   Physical Exam Vitals and nursing note reviewed. Exam conducted with a chaperone present.  Constitutional:      Appearance: Normal appearance. She is well-developed.  Pulmonary:     Effort: Pulmonary effort is normal.  Abdominal:     General: Abdomen is flat.     Palpations: Abdomen is soft.  Genitourinary:    General: Normal vulva.     Vagina: Vaginal discharge present. No erythema, bleeding or lesions.     Cervix: Normal. No discharge, friability, lesion or erythema.     Uterus: Normal.      Adnexa: Right adnexa normal and left adnexa normal.  Neurological:     Mental Status: She is alert.  Psychiatric:        Mood and Affect: Mood normal.        Thought Content: Thought content normal.        Judgment: Judgment normal.     Microscopic wet-mount exam shows negative for pathogens, normal epithelial cells.   Elizabeth Lucero, CMA present for exam  Assessment:/Plan:   1. Vaginal odor (Primary) Will begin weekly boric acid for prevention If symptoms persist will plan to remove IUD and place Nexplanon  - WET PREP FOR TRICH, YEAST, CLUE   Avoid intercourse until symptoms are resolved. Safe sex encouraged. Avoid the use of soaps or perfumed products in the peri area.  Avoid tub baths and sitting in sweaty or wet clothing for prolonged periods of time.     Elizabeth Lucero B, NP 12:19 PM

## 2024-03-21 ENCOUNTER — Ambulatory Visit: Admitting: Radiology

## 2024-04-12 ENCOUNTER — Ambulatory Visit: Admitting: Radiology

## 2024-05-20 ENCOUNTER — Ambulatory Visit: Admitting: Radiology
